# Patient Record
Sex: Male | Born: 1965 | Race: White | Hispanic: No | Marital: Single | Smoking: Current every day smoker
Health system: Southern US, Community
[De-identification: ages and names within clinical notes are randomized; demographics above are authoritative.]

## PROBLEM LIST (undated history)

## (undated) DIAGNOSIS — M5137 Other intervertebral disc degeneration, lumbosacral region: Secondary | ICD-10-CM

## (undated) DIAGNOSIS — M51379 Other intervertebral disc degeneration, lumbosacral region without mention of lumbar back pain or lower extremity pain: Secondary | ICD-10-CM

## (undated) HISTORY — PX: HAND SURGERY: SHX662

## (undated) HISTORY — DX: Other intervertebral disc degeneration, lumbosacral region without mention of lumbar back pain or lower extremity pain: M51.379

## (undated) HISTORY — DX: Other intervertebral disc degeneration, lumbosacral region: M51.37

---

## 2002-01-11 ENCOUNTER — Emergency Department (HOSPITAL_COMMUNITY): Admission: EM | Admit: 2002-01-11 | Discharge: 2002-01-11 | Payer: Self-pay | Admitting: Emergency Medicine

## 2008-10-31 ENCOUNTER — Ambulatory Visit (HOSPITAL_BASED_OUTPATIENT_CLINIC_OR_DEPARTMENT_OTHER): Admission: RE | Admit: 2008-10-31 | Discharge: 2008-10-31 | Payer: Self-pay | Admitting: Orthopedic Surgery

## 2009-01-17 ENCOUNTER — Ambulatory Visit (HOSPITAL_BASED_OUTPATIENT_CLINIC_OR_DEPARTMENT_OTHER): Admission: RE | Admit: 2009-01-17 | Discharge: 2009-01-17 | Payer: Self-pay | Admitting: Orthopedic Surgery

## 2009-01-29 ENCOUNTER — Encounter (INDEPENDENT_AMBULATORY_CARE_PROVIDER_SITE_OTHER): Payer: Self-pay | Admitting: Orthopedic Surgery

## 2009-01-29 ENCOUNTER — Ambulatory Visit (HOSPITAL_BASED_OUTPATIENT_CLINIC_OR_DEPARTMENT_OTHER): Admission: RE | Admit: 2009-01-29 | Discharge: 2009-01-29 | Payer: Self-pay | Admitting: Orthopedic Surgery

## 2011-03-30 LAB — WOUND CULTURE: Gram Stain: NONE SEEN

## 2011-03-30 LAB — POCT HEMOGLOBIN-HEMACUE: Hemoglobin: 15.4 g/dL (ref 13.0–17.0)

## 2011-03-30 LAB — ANAEROBIC CULTURE

## 2011-04-27 NOTE — Op Note (Signed)
NAMEJAAN, FISCHEL             ACCOUNT NO.:  [id number]   MEDICAL RECORD NO.:  [id number]          PATIENT TYPE:  AMB   LOCATION:  DSC                          FACILITY:  [facility]   PHYSICIAN:  Cindee Salt, M.D.       DATE OF BIRTH:  01/19/1966   DATE OF PROCEDURE:  01/29/2009  DATE OF DISCHARGE:                               OPERATIVE REPORT   PREOPERATIVE DIAGNOSIS:  Dupuytren contracture, left little finger.   POSTOPERATIVE DIAGNOSIS:  Dupuytren contracture, left little finger.   OPERATION:  Excision, palmar fascia, V-Y advancements with proximal  interphalangeal joint release, left little finger.   SURGEON:  Cindee Salt, MD   SUMMARYCarolyne Fiscal, RN   ANESTHESIA:  Axillary block.   ANESTHESIOLOGIST:  Zenon Mayo, MD.   HISTORY:  The patient is a 45 year old male with a history of Dupuytren  contracture of the little fingers bilaterally with 90 plus-degree  flexion deformity to the PIP joint, contracture of the MP joint.  He has  undergone application of the digit with correction to 30 degrees of PIP  flexion.  Unfortunately, he suffered an infection of the pin, the device  was removed.  He is admitted now for excision of the palmar fascia.  There is no evidence of infection at the present time.  He is aware of  risks and complications including infection; recurrence injury to  arteries, nerves, tendons; complete relief of symptoms; dystrophy;  recurrence of the Dupuytren cords; loss of flaps; swelling; stiffness to  the joints.  Questions have been encouraged and answered.  In the  preoperative area, the patient is seen, the extremity marked by both the  patient and surgeon, antibiotic given.  Questions again encouraged and  answered.   PROCEDURE:  The patient was brought to the operating room where an  axillary block was carried out without difficulty.  He was prepped using  DuraPrep in supine position with left arm free.  A time-out was taken  confirming the  patient and procedure.  The limb was exsanguinated with  an Esmarch bandage, tourniquet placed high on the arm was inflated to  250 mmHg with exsanguination only to the elbow.  A volar Brunner  incision was made, carried down through the subcutaneous tissue.  Bleeders were electrocauterized.  The dissection was carried distally  after identification of the palmar fascia.  This was released across the  entire extent.  This allowed elevation.  The digital neurovascular  bundles to each finger were identified.  The cord to the ring finger was  partially excised.  The cord to the little finger was then followed  distally.  This released metacarpophalangeal joint.  A large  contribution was present from the abductor digiti quinti, this cord was  excised.  A spiral nerve was present.  This was carefully dissected free  on the ulnar side.  The dissection was carried out to the middle  phalanx.  This released the entire cord which was sent to pathology.  Both neurovascular bundles were intact.  An incision was then made in  the flexor sheath.  This did not  allow further extension with extension  present to 30 degrees at this point.  The swallowtail ligaments were  then incised.  This allowed full extension of the PIP joint with partial  release of the accessory collateral ligaments.  The finger was able to  be fully straightened and maintained in that position.  The wound was  copiously irrigated with saline.  It was then sprayed with thrombin  spray.  A doubled-over vessel loop was placed in the depths of the  wound.  These were converted to Ys.  The skin was then advanced distally  and suturing into position with interrupted 4-0 Vicryl Rapide sutures.  The tourniquet deflated.  Fingers immediately pinked.  A sterile  compressive dressing splint was applied.  The patient tolerated the  procedure well, was taken to the recovery room for observation in  satisfactory condition.  He will be  discharged home, to return to the  [facility] in 1 week, on Vicodin.           ______________________________  Cindee Salt, M.D.     GK/MEDQ  D:  01/29/2009  T:  01/30/2009  Job:  [id number]

## 2011-04-27 NOTE — Op Note (Signed)
NAMEALBARAA, SWINGLE             ACCOUNT NO.:  [id number]   MEDICAL RECORD NO.:  [id number]          PATIENT TYPE:  AMB   LOCATION:  DSC                          FACILITY:  [facility]   PHYSICIAN:  Cindee Salt, M.D.       DATE OF BIRTH:  September 29, 1966   DATE OF PROCEDURE:  01/17/2009  DATE OF DISCHARGE:                               OPERATIVE REPORT   PREOPERATIVE DIAGNOSIS:  Status post application of Digit Widget, left  little finger.   POSTOPERATIVE DIAGNOSIS:  Status post application of Digit Widget, left  little finger.   OPERATION:  Removal of Digit Widget, left little finger.   SURGEON:  Cindee Salt, MD   ANESTHESIA:  Local.   HISTORY:  The patient is a 45 year old male with a history of Dupuytren  contracture of his left little finger with a 90-degree flexion  deformity.  He has had a Digit Widget placed in November.  He is  admitted now for removal and that the proximal pin has become infected.  He has had relief to approximately a 30-degree flexion deformity.   PROCEDURE:  The patient was brought to the operating room.  A metacarpal  block was given with 1% Xylocaine and 0.25% Marcaine, both without  epinephrine, 8 mL was used.  After adequate anesthesia was afforded, the  patient time-out was taken confirming the operation and the procedure.  Prep was done with DuraPrep.  The external device was removed.  The two  pins were then removed without difficulty.  Cultures were taken for both  aerobic and anaerobic cultures.  A sterile compressive dressing was  applied.  No gross purulence was noted.  The patient will be discharged  home to return in 1 week.  He is on Keflex and Tylenol 3.           ______________________________  Cindee Salt, M.D.     GK/MEDQ  D:  01/17/2009  T:  01/18/2009  Job:  [id number]

## 2011-04-27 NOTE — Op Note (Signed)
NAMEPAETON, STUDER             ACCOUNT NO.:  [id number]   MEDICAL RECORD NO.:  [id number]          PATIENT TYPE:  AMB   LOCATION:  DSC                          FACILITY:  [facility]   PHYSICIAN:  Cindee Salt, M.D.       DATE OF BIRTH:  [birth year]   DATE OF PROCEDURE:  10/31/2008  DATE OF DISCHARGE:                               OPERATIVE REPORT   PREOPERATIVE DIAGNOSIS:  Dupuytren's contracture with 95 degrees flexion  deformity, proximal interphalangeal joint, left little finger.   POSTOPERATIVE DIAGNOSIS:  Dupuytren's contracture with 95 degrees  flexion deformity, proximal interphalangeal joint, left little finger.   OPERATION:  Application of Digit-Widget, left little finger.   SURGEON:  Cindee Salt, MD   ASSISTANT:  Joaquin Courts, RN   ANESTHESIA:  Forearm-based IV regional.   ANESTHESIOLOGIST:  Zenon Mayo, MD   HISTORY:  The patient is a 45 year old male with a history of  Dupuytren's contracture both little fingers with 95 degrees fixed  flexion deformities to the PIP joints with flexion to the  metacarpophalangeal joint.  He is admitted now for application of a  Digit-Widget for extension of the PIP joint of the left little finger.  He is aware of risks and complications including infection, recurrence,  injury to arteries, nerves, tendons, incomplete relief of symptoms,  possibility of dystrophy, possibility of pullout of the device, and the  necessity of removal of the cord at a later date.   PROCEDURE IN DETAIL:  The patient was brought to the operating room  after marking the extremity by both the patient and surgeon.  Antibiotic  being given.  A forearm-based IV regional anesthetic was carried out  without difficulty.  He was prepped using DuraPrep, supine position,  left arm free.  The joint was located with a 22 gauge needle confirmed  with OEC image intensification.  The guide for the application of the 2  distal pins was then placed after marking the  joint line.  The guide was  placed just distal to this.  The proximal drill hole was then made and  confirmed with image intensification.  The second pin was placed  distally.  These were checked with image intensification to be certain  of their position.  The distal pin was then placed by hand and confirmed  into the volar cortex without penetration into the flexor tendons with  OEC.  The proximal screw was then placed.  Again, this was confirmed  with OEC image intensification to not significantly penetrate the volar  cortex, but to engage.  The pins were then cut.  The jig removed.  The  Digit-Widget was placed distally.  The pins were then cut short.  The  remainder of the device was applied along with a low-tension band.  X-  rays confirmed positioning the Digit-Widget.  The patient tolerated the  procedure well and was taken to the recovery room for observation in  satisfactory condition.  He will be discharged home to return to the  [facility] in 1 week on Vicodin.  ______________________________  Cindee Salt, M.D.     GK/MEDQ  D:  10/31/2008  T:  10/31/2008  Job:  [id number]

## 2011-09-14 LAB — POCT HEMOGLOBIN-HEMACUE: Hemoglobin: 15.7

## 2012-02-15 ENCOUNTER — Ambulatory Visit (INDEPENDENT_AMBULATORY_CARE_PROVIDER_SITE_OTHER): Payer: BC Managed Care – PPO | Admitting: Family Medicine

## 2012-02-15 VITALS — BP 128/76 | HR 75 | Temp 98.1°F | Resp 18 | Ht 72.5 in | Wt 208.0 lb

## 2012-02-15 DIAGNOSIS — J029 Acute pharyngitis, unspecified: Secondary | ICD-10-CM

## 2012-02-15 LAB — POCT RAPID STREP A (OFFICE): Rapid Strep A Screen: NEGATIVE

## 2012-02-15 MED ORDER — IPRATROPIUM BROMIDE 0.03 % NA SOLN: 2.0000 | Freq: Four times a day (QID) | NASAL | Status: DC

## 2012-02-15 NOTE — Progress Notes (Signed)
  Patient Name: William Benson Date of Birth: 02/27/1966 Medical Record Number: [id number] Gender: male Date of Encounter: 02/15/2012  History of Present Illness:  William Benson is a 46 y.o. very pleasant male patient who presents with the following:  About 10 days ago he noted a mild ST, and URI symptoms.  Tried allergy meds.  Yesterday noted that his tongue looks white- he had a similar problem in 2011. However, he had a negative KOH and HIV at that time- unclear if he had thrush for sure at that time for not.  Other URI symtposm are better.  It does hurt to eat and drink because his tongue feels sore.  Voice is still horse sometimes.   No recent nasal steroid use, no recent antibiotic use  There is no problem list on file for this patient.  History reviewed. No pertinent past medical history. Past Surgical History  Procedure Date  . Hand surgery    History  Substance Use Topics  . Smoking status: Current Everyday Smoker -- 0.5 packs/day    Types: Cigarettes  . Smokeless tobacco: Never Used  . Alcohol Use: 3.6 oz/week    6 Cans of beer per week   Family History  Problem Relation Age of Onset  . Diabetes Father    Allergies  Allergen Reactions  . Oxycodone Rash    Medication list has been reviewed and updated.  Review of Systems: As per HPI- otherwise negative. No weight loss- has actually gained a few lbs and plans to get back into exercising  Physical Examination: Filed Vitals:   02/15/12 0828  BP: 128/76  Pulse: 75  Temp: 98.1 F (36.7 C)  TempSrc: Oral  Resp: 18  Height: 6' 0.5" (1.842 m)  Weight: 208 lb (94.348 kg)    Body mass index is 27.82 kg/(m^2).  GEN: WDWN, NAD, Non-toxic, A & O x 3 HEENT: Atraumatic, Normocephalic. Neck supple. No masses, No LAD.  TM wnl bilaterally, nasal cavity is congested and inflamed.  Oropharynx shows erythema- there is no exudate and no thrush Ears and Nose: No external deformity. CV: RRR, No M/G/R. No JVD. No  thrill. No extra heart sounds. PULM: CTA B, no wheezes, crackles, rhonchi. No retractions. No resp. distress. No accessory muscle use. EXTR: No c/c/e NEURO Normal gait.  PSYCH: Normally interactive. Conversant. Not depressed or anxious appearing.  Calm demeanor.   Results for orders placed in visit on 02/15/12  POCT RAPID STREP A (OFFICE)      Component Value Range   Rapid Strep A Screen Negative  Negative     Assessment and Plan: 1. Sore throat  POCT rapid strep A, Throat culture ([facility]), ipratropium (ATROVENT) 0.03 % nasal spray   Suspect he has a viral infection vs PND causing ST.  I do not think that he has oral thrush, and will defer antibiotics as he has a negative RS- await throat culture.  Trial of meds above- let us know if not better in a couple of days- Sooner if worse.    Gave rx for DMM

## 2012-02-17 LAB — CULTURE, GROUP A STREP: Organism ID, Bacteria: NORMAL

## 2012-02-22 ENCOUNTER — Ambulatory Visit: Payer: BC Managed Care – PPO

## 2012-02-22 ENCOUNTER — Ambulatory Visit (INDEPENDENT_AMBULATORY_CARE_PROVIDER_SITE_OTHER): Payer: BC Managed Care – PPO | Admitting: Family Medicine

## 2012-02-22 VITALS — BP 118/75 | HR 75 | Temp 97.9°F | Resp 16 | Ht 73.0 in | Wt 206.0 lb

## 2012-02-22 DIAGNOSIS — J029 Acute pharyngitis, unspecified: Secondary | ICD-10-CM

## 2012-02-22 DIAGNOSIS — K219 Gastro-esophageal reflux disease without esophagitis: Secondary | ICD-10-CM

## 2012-02-22 MED ORDER — AZITHROMYCIN 250 MG PO TABS: ORAL_TABLET | ORAL | Status: AC

## 2012-02-22 NOTE — Progress Notes (Signed)
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 He's tried multiple over-the-counter medications since his father is a Teacher, early years/pre. He also came in a week ago had a throat culture and some evaluation but these measure mouthwash prescribed has not resolved the problem. He's not had any fever, and the cough is just intermittent to clear his throat. He does not have a history of allergies and does not have sinus congestion at present.  Objective: HEENT is unremarkable with the exception of red throat and some postnasal drainage.  He's in no acute distress, very nice man who is cooperative and alert.  Chest is clear  Heart is regular  Neck is supple without adenopathy  Assessment persistent pharyngitis  Plan Z-Pak

## 2012-09-25 ENCOUNTER — Ambulatory Visit (INDEPENDENT_AMBULATORY_CARE_PROVIDER_SITE_OTHER): Payer: BC Managed Care – PPO | Admitting: Internal Medicine

## 2012-09-25 ENCOUNTER — Encounter: Payer: Self-pay | Admitting: Internal Medicine

## 2012-09-25 VITALS — BP 126/74 | HR 80 | Temp 97.8°F | Resp 16 | Ht 73.0 in | Wt 213.0 lb

## 2012-09-25 DIAGNOSIS — R5381 Other malaise: Secondary | ICD-10-CM

## 2012-09-25 DIAGNOSIS — R5383 Other fatigue: Secondary | ICD-10-CM

## 2012-09-25 DIAGNOSIS — R61 Generalized hyperhidrosis: Secondary | ICD-10-CM

## 2012-09-25 DIAGNOSIS — N529 Male erectile dysfunction, unspecified: Secondary | ICD-10-CM

## 2012-09-25 DIAGNOSIS — G4700 Insomnia, unspecified: Secondary | ICD-10-CM

## 2012-09-25 LAB — POCT CBC
Granulocyte percent: 70.8 %G (ref 37–80)
HCT, POC: 49.5 % (ref 43.5–53.7)
POC Granulocyte: 6.9 (ref 2–6.9)
POC LYMPH PERCENT: 23.9 %L (ref 10–50)
RBC: 5.07 M/uL (ref 4.69–6.13)
RDW, POC: 12.9 %

## 2012-09-25 LAB — COMPREHENSIVE METABOLIC PANEL
AST: 36 U/L (ref 0–37)
Albumin: 4.5 g/dL (ref 3.5–5.2)
Alkaline Phosphatase: 57 U/L (ref 39–117)
Glucose, Bld: 94 mg/dL (ref 70–99)
Potassium: 4.8 mEq/L (ref 3.5–5.3)
Sodium: 140 mEq/L (ref 135–145)
Total Protein: 6.6 g/dL (ref 6.0–8.3)

## 2012-09-25 LAB — LIPID PANEL
LDL Cholesterol: 155 mg/dL — ABNORMAL HIGH (ref 0–99)
Triglycerides: 174 mg/dL — ABNORMAL HIGH (ref <150)

## 2012-09-25 LAB — PSA: PSA: 1.08 ng/mL (ref <=4.00)

## 2012-09-25 LAB — POCT SEDIMENTATION RATE: POCT SED RATE: 6 mm/hr (ref 0–22)

## 2012-09-25 MED ORDER — ZOLPIDEM TARTRATE 10 MG PO TABS: 10.0000 mg | ORAL_TABLET | Freq: Every evening | ORAL | Status: DC | PRN

## 2012-09-25 NOTE — Progress Notes (Addendum)
  Subjective:    Patient ID: William Benson, male    DOB: [birth year], 46 y.o.   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 Continues to smoke but is greatly decreased and now below half pack per day Occasional alcohol/no illegal drugs  Review of Systems Dup contrac/Kuzma surgery 2 yr ago   AR-Zyrtec/atrovent nasal-stable GERD now stable onprevecid No reactive airway disease or dyspnea No palpitations chest pain or edema No abdominal pain/no change in bowel movements No dysuria frequency nocturia or polyuria Has episodes of erectile dysfunction though in frequent No urinary obstruction symptoms/no testicular pain No neuromuscular bone and joint complaints No cold intolerance/no hair or skin changes No depression or anxiety Objective:   Physical Exam Blood pressure 126/74 Weight 213 height 6 feet 1 inches HEENT clear No thyromegaly or lymphadenopathy Lungs clear to auscultation Heart regular without murmur Abdomen benign without organomegaly or masses Extremities with full peripheral pulses Cranial 2 through 12 intact Gait intact     Assessment & Plan:   1. Fatigue  POCT CBC, Comprehensive metabolic panel, TSH  2. ED (erectile dysfunction)  Lipid panel, PSA, Testosterone, free, total  3. Insomnia    4. Night sweats  POCT SEDIMENTATION RATE  5.  Nicotine addiction             Continue cessation If all labs are normal then we will approach this as a sleep problem and triy to ensure adequate sleep for a  month to see the effect on his symptoms Meds ordered this encounter  Medications  . lansoprazole (PREVACID) 15 MG capsule    Sig: Take 15 mg by mouth daily.     He has this  . zyrtec    Sig: Take 10 mg by mouth daily.      He has this  . zolpidem (AMBIEN) 10 MG tablet    Sig: Take 1 tablet (10 mg total) by mouth at bedtime as needed for sleep.    Dispense:  30 tablet    Refill:  1

## 2012-09-26 LAB — TESTOSTERONE, FREE, TOTAL, SHBG: Testosterone: 477 ng/dL (ref 300–890)

## 2012-09-30 ENCOUNTER — Encounter: Payer: Self-pay | Admitting: Internal Medicine

## 2012-11-01 ENCOUNTER — Ambulatory Visit (INDEPENDENT_AMBULATORY_CARE_PROVIDER_SITE_OTHER): Payer: BC Managed Care – PPO | Admitting: Internal Medicine

## 2012-11-01 VITALS — BP 115/71 | HR 82 | Temp 97.8°F | Resp 16 | Ht 73.0 in | Wt 211.0 lb

## 2012-11-01 DIAGNOSIS — R5383 Other fatigue: Secondary | ICD-10-CM

## 2012-11-01 DIAGNOSIS — R5381 Other malaise: Secondary | ICD-10-CM

## 2012-11-01 NOTE — Progress Notes (Signed)
  Subjective:    Patient ID: William Benson, male    DOB: 15-Nov-1966, 46 y.o.   MRN: [id number]  HPIsee 10/13 Sleeping better/more energy Sees the need for exercise and wt loss-espec w/ lipids More optimistic Still no sleep apnea axt   Review of Systems No new sxtoms   GERD stable Objective:   Physical Exam VS stable     Assessment & Plan:  Fatigue/insomnia/erectile dysfunction  Continue with plan for Ambien if needed/better diet/exercise and weight loss/repeat lipids in 6 months Prescription for Ambien-he has enough now-can call when he needs this until f/u 6 mos #20tabs

## 2012-12-28 ENCOUNTER — Other Ambulatory Visit: Payer: Self-pay | Admitting: Internal Medicine

## 2013-02-12 ENCOUNTER — Ambulatory Visit: Payer: BC Managed Care – PPO

## 2013-02-12 ENCOUNTER — Ambulatory Visit (INDEPENDENT_AMBULATORY_CARE_PROVIDER_SITE_OTHER): Payer: BC Managed Care – PPO | Admitting: Family Medicine

## 2013-02-12 DIAGNOSIS — S61409A Unspecified open wound of unspecified hand, initial encounter: Secondary | ICD-10-CM

## 2013-02-12 DIAGNOSIS — M79609 Pain in unspecified limb: Secondary | ICD-10-CM

## 2013-02-12 DIAGNOSIS — S61401A Unspecified open wound of right hand, initial encounter: Secondary | ICD-10-CM

## 2013-02-12 DIAGNOSIS — W19XXXA Unspecified fall, initial encounter: Secondary | ICD-10-CM

## 2013-02-12 DIAGNOSIS — Z23 Encounter for immunization: Secondary | ICD-10-CM

## 2013-02-12 DIAGNOSIS — M720 Palmar fascial fibromatosis [Dupuytren]: Secondary | ICD-10-CM

## 2013-02-12 DIAGNOSIS — G4700 Insomnia, unspecified: Secondary | ICD-10-CM

## 2013-02-12 MED ORDER — ZOLPIDEM TARTRATE 10 MG PO TABS: ORAL_TABLET | ORAL | Status: DC

## 2013-02-12 NOTE — Progress Notes (Signed)
[facility] [address] [place] [address] [phone]  Date:  02/12/2013   Name:  William Benson   DOB:  09/23/1966   MRN:  [id number]  PCP:  Tally Due, MD    Chief Complaint: Hand Pain   History of Present Illness:  William Benson is a 47 y.o. very pleasant male patient who presents with the following:  9 days ago he fell and hurt his right hand- he tripped over a walkway and did a sort of FOOSH on his right hand. The area is less swollen now but still tender.   No other injury, no head injury He did cut his right long finger a little bit- this has healed He has "no idea" of the date of his last tetanus shot The hand never did bruise His hand felt quite sore last night so he decided to come in- however today it is finally feeling better  He does have Dupuytren's contracture of both hands- his left hand has been operated on by Dr. Merlyn Lot, his left hand has involvement of the small finger  He also uses ambien PRN for sleep and has good success with this- would like a refill if possible Reviewed paper chart- no tetanus vaccine present  Patient Active Problem List  Diagnosis  . GERD (gastroesophageal reflux disease)    History reviewed. No pertinent past medical history.  Past Surgical History  Procedure Laterality Date  . Hand surgery      History  Substance Use Topics  . Smoking status: Current Every Day Smoker -- 0.50 packs/day    Types: Cigarettes  . Smokeless tobacco: Never Used  . Alcohol Use: 3.6 oz/week    6 Cans of beer per week    Family History  Problem Relation Age of Onset  . Diabetes Father     Allergies  Allergen Reactions  . Oxycodone Rash    Medication list has been reviewed and updated.  Current Outpatient Prescriptions on File Prior to Visit  Medication Sig Dispense Refill  . cetirizine (ZYRTEC) 10 MG tablet Take 10 mg by mouth daily.      . lansoprazole (PREVACID) 15 MG capsule Take 15 mg by  mouth daily.      Marland Kitchen zolpidem (AMBIEN) 10 MG tablet TAKE 1 TABLET BY MOUTH EVERY NIGHT AT BEDTIME AS NEEDED FOR SLEEP  30 tablet  0  . dexlansoprazole (DEXILANT) 60 MG capsule Take 60 mg by mouth daily.      Marland Kitchen ipratropium (ATROVENT) 0.03 % nasal spray Place 2 sprays into the nose 4 (four) times daily.  30 mL  6   No current facility-administered medications on file prior to visit.    Review of Systems:  As per HPI- otherwise negative.   Physical Examination: Filed Vitals:   02/12/13 0805  BP: 133/89  Pulse: 89  Temp: 98.2 F (36.8 C)  Resp: 18   Filed Vitals:   02/12/13 0805  Height: 6\' 2"  (1.88 m)  Weight: 213 lb (96.616 kg)   Body mass index is 27.34 kg/(m^2). Ideal Body Weight: Weight in (lb) to have BMI = 25: 194.3  GEN: WDWN, NAD, Non-toxic, A & O x 3 HEENT: Atraumatic, Normocephalic. Neck supple. No masses, No LAD. Ears and Nose: No external deformity. CV: RRR, No M/G/R. No JVD. No thrill. No extra heart sounds. PULM: CTA B, no wheezes, crackles, rhonchi. No retractions. No resp. distress. No accessory muscle use. EXTR: No c/c/e NEURO Normal gait.  PSYCH: Normally interactive. Conversant. Not depressed or anxious appearing.  Calm demeanor.  Right hand: he has contracture of the small finger, and some involvement of the ring finger.  Slight swelling over the dorsum of the hand, and tenderness over the 4th [facility] and 4th proximal phalanx.  Minimal swelling and no bruise  [facility] reading (PRIMARY) by  Dr. Patsy Lager. Right hand: (pt has Dupuytren's contracture)- contracture slightly limits interpretation but no fracture is noted RIGHT HAND - COMPLETE 3+ VIEW  Comparison: None.  Findings: Flexion deformity of the right fifth finger. This limits  evaluation because of overlapping bony shadows. No definite acute  displaced fracture or focal swelling. No soft tissue abnormality.  IMPRESSION:  Flexion deformity of the right fifth digit compatible with  contracture.  No acute  osseous finding.  Assessment and Plan: Pain, hand, right - Plan: DG Hand Complete Right  Fall, initial encounter  Insomnia - Plan: zolpidem (AMBIEN) 10 MG tablet  Open wound of hand with complication, right, initial encounter - Plan: Tdap vaccine greater than or equal to 7yo IM  Dupuytren's contracture of both hands  Updated tdap today.  Refilled ambien.  He is aware that his x-rays are somewhat limited by his contracture.  Offered to place a splint today.  However, he prefers to see how he does over the next few days as his hand is feeling better today.  He will follow- up if not continuing to improve  Meds ordered this encounter  Medications  . Lysine HCl 1000 MG TABS    Sig: Take by mouth.  . zolpidem (AMBIEN) 10 MG tablet    Sig: TAKE 1 TABLET BY MOUTH EVERY NIGHT AT BEDTIME AS NEEDED FOR SLEEP    Dispense:  30 tablet    Refill:  2     COPLAND,JESSICA, MD

## 2013-02-12 NOTE — Patient Instructions (Addendum)
Keep an eye on your hand- if it does not continue to get better please call or come back and see me later on this week.

## 2013-05-02 ENCOUNTER — Encounter: Payer: Self-pay | Admitting: Internal Medicine

## 2013-05-02 ENCOUNTER — Ambulatory Visit (INDEPENDENT_AMBULATORY_CARE_PROVIDER_SITE_OTHER): Payer: BC Managed Care – PPO | Admitting: Internal Medicine

## 2013-05-02 VITALS — BP 116/66 | HR 71 | Temp 97.9°F | Resp 16 | Ht 72.5 in | Wt 197.2 lb

## 2013-05-02 DIAGNOSIS — R7989 Other specified abnormal findings of blood chemistry: Secondary | ICD-10-CM

## 2013-05-02 DIAGNOSIS — E785 Hyperlipidemia, unspecified: Secondary | ICD-10-CM

## 2013-05-02 DIAGNOSIS — R945 Abnormal results of liver function studies: Secondary | ICD-10-CM

## 2013-05-02 DIAGNOSIS — G4700 Insomnia, unspecified: Secondary | ICD-10-CM

## 2013-05-02 LAB — CBC WITH DIFFERENTIAL/PLATELET
Basophils Relative: 1 % (ref 0–1)
Eosinophils Absolute: 0.1 10*3/uL (ref 0.0–0.7)
Eosinophils Relative: 1 % (ref 0–5)
Hemoglobin: 14.8 g/dL (ref 13.0–17.0)
MCH: 31.4 pg (ref 26.0–34.0)
MCHC: 35 g/dL (ref 30.0–36.0)
Monocytes Relative: 6 % (ref 3–12)
Neutrophils Relative %: 59 % (ref 43–77)
Platelets: 281 10*3/uL (ref 150–400)

## 2013-05-02 LAB — LIPID PANEL
Total CHOL/HDL Ratio: 4.3 Ratio
VLDL: 31 mg/dL (ref 0–40)

## 2013-05-02 LAB — COMPREHENSIVE METABOLIC PANEL
AST: 31 U/L (ref 0–37)
Albumin: 4.7 g/dL (ref 3.5–5.2)
Alkaline Phosphatase: 63 U/L (ref 39–117)
Potassium: 4.5 mEq/L (ref 3.5–5.3)
Sodium: 136 mEq/L (ref 135–145)
Total Protein: 6.6 g/dL (ref 6.0–8.3)

## 2013-05-02 MED ORDER — ZOLPIDEM TARTRATE 10 MG PO TABS: ORAL_TABLET | ORAL | Status: DC

## 2013-05-02 NOTE — Progress Notes (Addendum)
  Subjective:    Patient ID: William Benson, male    DOB: [birth year], 47 y.o.   MRN: [id number]  HPI here for followup Cholesterol 251/LDL 155 6 months ago He has lost over 15 pounds/his diet is excellent At his last labs he also had one elevated liver enzyme  Diet-greatly improved over last visit Activity-greatly increased including time in the gym [employer] [place]  Review of Systems Lysine has prevented oral ulcers Prevacid controls reflux Zyrtec helps allergies   his insomnia is much improved as well and he needs rare doses of Ambien Objective:   Physical Exam BP 116/66  Pulse 71  Temp(Src) 97.9 F (36.6 C) (Oral)  Resp 16  Ht 6' 0.5" (1.842 m)  Wt 197 lb 3.2 oz (89.449 kg)  BMI 26.36 kg/m2  SpO2 96% HEENT clear Heart regular Extremities clear       Assessment & Plan:  Problem #1 hyperlipidemia Problem 2 overweight Problem #3 allergic rhinitis 4 recurrent oral ulcers Problem 5 insomnia Problem #6 abnormal liver function studies  Meds ordered this encounter  Medications  . zolpidem (AMBIEN) 10 MG tablet    Sig: TAKE 1 TABLET BY MOUTH EVERY NIGHT AT BEDTIME AS NEEDED FOR SLEEP    Dispense:  30 tablet    Refill:  5   Notify labs He is doing extremely well

## 2013-05-02 NOTE — Progress Notes (Signed)
  Subjective:    Patient ID: William Benson, male    DOB: 06-04-1966, 47 y.o.   MRN: [id number]  HPI    Review of Systems  Constitutional: Negative.   HENT: Negative.   Eyes: Negative.   Respiratory: Negative.   Cardiovascular: Negative.   Gastrointestinal: Negative.   Endocrine: Negative.   Genitourinary: Negative.   Musculoskeletal: Negative.   Skin: Negative.   Allergic/Immunologic: Negative.   Neurological: Negative.   Hematological: Negative.   Psychiatric/Behavioral: Negative.        Objective:   Physical Exam        Assessment & Plan:

## 2013-05-03 LAB — PSA: PSA: 0.95 ng/mL (ref <=4.00)

## 2013-05-04 ENCOUNTER — Encounter: Payer: Self-pay | Admitting: Internal Medicine

## 2013-10-31 ENCOUNTER — Encounter: Payer: Self-pay | Admitting: Internal Medicine

## 2013-10-31 ENCOUNTER — Ambulatory Visit (INDEPENDENT_AMBULATORY_CARE_PROVIDER_SITE_OTHER): Payer: BC Managed Care – PPO | Admitting: Internal Medicine

## 2013-10-31 VITALS — BP 118/70 | HR 83 | Temp 97.9°F | Resp 16 | Ht 73.0 in | Wt 212.0 lb

## 2013-10-31 DIAGNOSIS — G4700 Insomnia, unspecified: Secondary | ICD-10-CM

## 2013-10-31 DIAGNOSIS — E785 Hyperlipidemia, unspecified: Secondary | ICD-10-CM

## 2013-10-31 LAB — LIPID PANEL: Total CHOL/HDL Ratio: 5.8 Ratio

## 2013-10-31 MED ORDER — ZOLPIDEM TARTRATE 10 MG PO TABS: ORAL_TABLET | ORAL | Status: DC

## 2013-10-31 NOTE — Progress Notes (Signed)
  Subjective:    Patient ID: William Benson, male    DOB: May 31, 1966, 47 y.o.   MRN: [id number]  HPI Patient presents today for follow up of hyperlipidemia. Has had increased weight gain. Says this is typical for him in the summer time. He usually gains weight over spring/summer when he is busy and loses over the fall/winter when he has more time to go to the gym. Walks greens each day.   Had a groin injury several months ago and this is resolved. Otherwise, everything going well. Occasional right fourth digit pain from injury about a year ago. No swelling. Takes [place] most, but not all nights. Usually awakens 3-4 am, even with ambien. Goes back to sleep. No excessive daytime sleepiness.   Refuses flu shot. Never takes.  Review of Systems neg    Objective:   Physical Exam  Nursing note and vitals reviewed. Constitutional: He is oriented to person, place, and time. He appears well-developed and well-nourished. No distress.  HENT:  Head: Normocephalic and atraumatic.  Eyes: Pupils are equal, round, and reactive to light.  Neck: Normal range of motion.  Cardiovascular: Normal rate and regular rhythm.   Pulmonary/Chest: Effort normal. No respiratory distress.  Musculoskeletal: Normal range of motion.  Neurological: He is alert and oriented to person, place, and time.  Skin: Skin is warm and dry.  Psychiatric: He has a normal mood and affect. His behavior is normal.   BP 118/70  Pulse 83  Temp(Src) 97.9 F (36.6 C) (Oral)  Resp 16  Ht 6\' 1"  (1.854 m)  Wt 212 lb (96.163 kg)  BMI 27.98 kg/m2  SpO2 97%        Assessment & Plan:  Insomnia - Plan: zolpidem (AMBIEN) 10 MG tablet  Other and unspecified hyperlipidemia - Plan: Lipid panel  Meds ordered this encounter  Medications  . zolpidem (AMBIEN) 10 MG tablet    Sig: TAKE 1 TABLET BY MOUTH EVERY NIGHT AT BEDTIME AS NEEDED FOR SLEEP    Dispense:  30 tablet    Refill:  5   F/u 3mo

## 2013-11-05 ENCOUNTER — Encounter: Payer: Self-pay | Admitting: Internal Medicine

## 2013-12-11 ENCOUNTER — Ambulatory Visit (INDEPENDENT_AMBULATORY_CARE_PROVIDER_SITE_OTHER): Payer: BC Managed Care – PPO | Admitting: Emergency Medicine

## 2013-12-11 VITALS — BP 121/84 | HR 90 | Temp 98.0°F | Resp 18 | Ht 73.0 in | Wt 208.0 lb

## 2013-12-11 DIAGNOSIS — J209 Acute bronchitis, unspecified: Secondary | ICD-10-CM

## 2013-12-11 DIAGNOSIS — J018 Other acute sinusitis: Secondary | ICD-10-CM

## 2013-12-11 MED ORDER — PROMETHAZINE-CODEINE 6.25-10 MG/5ML PO SYRP: 5.0000 mL | ORAL_SOLUTION | Freq: Four times a day (QID) | ORAL | Status: DC | PRN

## 2013-12-11 MED ORDER — AMOXICILLIN-POT CLAVULANATE 875-125 MG PO TABS: 1.0000 | ORAL_TABLET | Freq: Two times a day (BID) | ORAL | Status: DC

## 2013-12-11 MED ORDER — PSEUDOEPHEDRINE-GUAIFENESIN ER 60-600 MG PO TB12: 1.0000 | ORAL_TABLET | Freq: Two times a day (BID) | ORAL | Status: DC

## 2013-12-11 NOTE — Progress Notes (Signed)
[facility] [address] [place] [address] [phone]  Date:  12/11/2013   Name:  William Benson   DOB:  20-Aug-1966   MRN:  [id number]  PCP:  Tally Due, MD    Chief Complaint: Cough and Sore Throat   History of Present Illness:  William Benson is a 47 y.o. very pleasant male patient who presents with the following:  1 week sore in mouth worse since Sunday.  On lateral tongue.  Now worse. Has a sore throat and nasal congestion and drainage.  Clear mucoid discharge.  No fever or chills.  No nausea or vomiting.  Has a cough with minimal purulent sputum.  No wheezing or shortness of breath.  No nausea or vomiting.  No stool change or rash.   History of SAR.  No improvement with over the counter medications or other home remedies. Denies other complaint or health concern today.   Patient Active Problem List   Diagnosis Date Noted  . Insomnia 10/31/2013  . Other and unspecified hyperlipidemia 05/02/2013  . Dupuytren's contracture of both hands 02/12/2013  . GERD (gastroesophageal reflux disease) 02/22/2012    No past medical history on file.  Past Surgical History  Procedure Laterality Date  . Hand surgery      History  Substance Use Topics  . Smoking status: Current Every Day Smoker -- 0.50 packs/day    Types: Cigarettes  . Smokeless tobacco: Never Used  . Alcohol Use: 3.6 oz/week    6 Cans of beer per week    Family History  Problem Relation Age of Onset  . Diabetes Father   . Heart disease Mother     Allergies  Allergen Reactions  . Oxycodone Rash    Medication list has been reviewed and updated.  Current Outpatient Prescriptions on File Prior to Visit  Medication Sig Dispense Refill  . cetirizine (ZYRTEC) 10 MG tablet Take 10 mg by mouth daily.      . lansoprazole (PREVACID) 15 MG capsule Take 15 mg by mouth daily.      Marland Kitchen Lysine HCl 1000 MG TABS Take by mouth.      . zolpidem (AMBIEN) 10 MG tablet TAKE 1 TABLET BY  MOUTH EVERY NIGHT AT BEDTIME AS NEEDED FOR SLEEP  30 tablet  5   No current facility-administered medications on file prior to visit.    Review of Systems:  As per HPI, otherwise negative.    Physical Examination: Filed Vitals:   12/11/13 0839  BP: 121/84  Pulse: 90  Temp: 98 F (36.7 C)  Resp: 18   Filed Vitals:   12/11/13 0839  Height: 6\' 1"  (1.854 m)  Weight: 208 lb (94.348 kg)   Body mass index is 27.45 kg/(m^2). Ideal Body Weight: Weight in (lb) to have BMI = 25: 189.1  GEN: WDWN, NAD, Non-toxic, A & O x 3 HEENT: Atraumatic, Normocephalic. Neck supple. No masses, No LAD.  Aphthous ulcer tongue. Ears and Nose: No external deformity. CV: RRR, No M/G/R. No JVD. No thrill. No extra heart sounds. PULM: CTA B, no wheezes, crackles, rhonchi. No retractions. No resp. distress. No accessory muscle use. ABD: S, NT, ND, +BS. No rebound. No HSM. EXTR: No c/c/e NEURO Normal gait.  PSYCH: Normally interactive. Conversant. Not depressed or anxious appearing.  Calm demeanor.    Assessment and Plan: Bronchitis Aphthous ulcer SAR mucinex d augmentin Phen c cod   Signed,  Phillips Odor, MD

## 2013-12-11 NOTE — Patient Instructions (Signed)
Sinusitis Sinusitis is redness, soreness, and swelling (inflammation) of the paranasal sinuses. Paranasal sinuses are air pockets within the bones of your face (beneath the eyes, the middle of the forehead, or above the eyes). In healthy paranasal sinuses, mucus is able to drain out, and air is able to circulate through them by way of your nose. However, when your paranasal sinuses are inflamed, mucus and air can become trapped. This can allow bacteria and other germs to grow and cause infection. Sinusitis can develop quickly and last only a short time (acute) or continue over a long period (chronic). Sinusitis that lasts for more than 12 weeks is considered chronic.  CAUSES  Causes of sinusitis include:  Allergies.  Structural abnormalities, such as displacement of the cartilage that separates your nostrils (deviated septum), which can decrease the air flow through your nose and sinuses and affect sinus drainage.  Functional abnormalities, such as when the small hairs (cilia) that line your sinuses and help remove mucus do not work properly or are not present. SYMPTOMS  Symptoms of acute and chronic sinusitis are the same. The primary symptoms are pain and pressure around the affected sinuses. Other symptoms include:  Upper toothache.  Earache.  Headache.  Bad breath.  Decreased sense of smell and taste.  A cough, which worsens when you are lying flat.  Fatigue.  Fever.  Thick drainage from your nose, which often is green and may contain pus (purulent).  Swelling and warmth over the affected sinuses. DIAGNOSIS  Your caregiver will perform a physical exam. During the exam, your caregiver may:  Look in your nose for signs of abnormal growths in your nostrils (nasal polyps).  Tap over the affected sinus to check for signs of infection.  View the inside of your sinuses (endoscopy) with a special imaging device with a light attached (endoscope), which is inserted into your  sinuses. If your caregiver suspects that you have chronic sinusitis, one or more of the following tests may be recommended:  Allergy tests.  Nasal culture A sample of mucus is taken from your nose and sent to a lab and screened for bacteria.  Nasal cytology A sample of mucus is taken from your nose and examined by your caregiver to determine if your sinusitis is related to an allergy. TREATMENT  Most cases of acute sinusitis are related to a viral infection and will resolve on their own within 10 days. Sometimes medicines are prescribed to help relieve symptoms (pain medicine, decongestants, nasal steroid sprays, or saline sprays).  However, for sinusitis related to a bacterial infection, your caregiver will prescribe antibiotic medicines. These are medicines that will help kill the bacteria causing the infection.  Rarely, sinusitis is caused by a fungal infection. In theses cases, your caregiver will prescribe antifungal medicine. For some cases of chronic sinusitis, surgery is needed. Generally, these are cases in which sinusitis recurs more than 3 times per year, despite other treatments. HOME CARE INSTRUCTIONS   Drink plenty of water. Water helps thin the mucus so your sinuses can drain more easily.  Use a humidifier.  Inhale steam 3 to 4 times a day (for example, sit in the bathroom with the shower running).  Apply a warm, moist washcloth to your face 3 to 4 times a day, or as directed by your caregiver.  Use saline nasal sprays to help moisten and clean your sinuses.  Take over-the-counter or prescription medicines for pain, discomfort, or fever only as directed by your caregiver. SEEK IMMEDIATE MEDICAL   CARE IF:  You have increasing pain or severe headaches.  You have nausea, vomiting, or drowsiness.  You have swelling around your face.  You have vision problems.  You have a stiff neck.  You have difficulty breathing. MAKE SURE YOU:   Understand these  instructions.  Will watch your condition.  Will get help right away if you are not doing well or get worse. Document Released: 11/29/2005 Document Revised: 02/21/2012 Document Reviewed: 12/14/2011 ExitCare Patient Information 2014 [employer].   Bronchitis Bronchitis is the body's way of reacting to injury and/or infection (inflammation) of the bronchi. Bronchi are the air tubes that extend from the windpipe into the lungs. If the inflammation becomes severe, it may cause shortness of breath. CAUSES  Inflammation may be caused by:  A virus.  Germs (bacteria).  Dust.  Allergens.  Pollutants and many other irritants. The cells lining the bronchial tree are covered with tiny hairs (cilia). These constantly beat upward, away from the lungs, toward the mouth. This keeps the lungs free of pollutants. When these cells become too irritated and are unable to do their job, mucus begins to develop. This causes the characteristic cough of bronchitis. The cough clears the lungs when the cilia are unable to do their job. Without either of these protective mechanisms, the mucus would settle in the lungs. Then you would develop pneumonia. Smoking is a common cause of bronchitis and can contribute to pneumonia. Stopping this habit is the single most important thing you can do to help yourself. TREATMENT   Your caregiver may prescribe an antibiotic if the cough is caused by bacteria. Also, medicines that open up your airways make it easier to breathe. Your caregiver may also recommend or prescribe an expectorant. It will loosen the mucus to be coughed up. Only take over-the-counter or prescription medicines for pain, discomfort, or fever as directed by your caregiver.  Removing whatever causes the problem (smoking, for example) is critical to preventing the problem from getting worse.  Cough suppressants may be prescribed for relief of cough symptoms.  Inhaled medicines may be prescribed to help  with symptoms now and to help prevent problems from returning.  For those with recurrent (chronic) bronchitis, there may be a need for steroid medicines. SEEK IMMEDIATE MEDICAL CARE IF:   During treatment, you develop more pus-like mucus (purulent sputum).  You have a fever.  You become progressively more ill.  You have increased difficulty breathing, wheezing, or shortness of breath. It is necessary to seek immediate medical care if you are elderly or sick from any other disease. MAKE SURE YOU:   Understand these instructions.  Will watch your condition.  Will get help right away if you are not doing well or get worse. Document Released: 11/29/2005 Document Revised: 08/01/2013 Document Reviewed: 07/24/2013 ExitCare Patient Information 2014 [employer].  

## 2014-05-01 ENCOUNTER — Ambulatory Visit: Payer: BC Managed Care – PPO | Admitting: Internal Medicine

## 2014-09-04 ENCOUNTER — Encounter: Payer: Self-pay | Admitting: Internal Medicine

## 2014-09-04 ENCOUNTER — Ambulatory Visit (INDEPENDENT_AMBULATORY_CARE_PROVIDER_SITE_OTHER): Payer: BC Managed Care – PPO | Admitting: Internal Medicine

## 2014-09-04 VITALS — BP 110/72 | HR 88 | Temp 98.0°F | Resp 16 | Ht 73.0 in | Wt 193.8 lb

## 2014-09-04 DIAGNOSIS — E785 Hyperlipidemia, unspecified: Secondary | ICD-10-CM

## 2014-09-04 DIAGNOSIS — G4700 Insomnia, unspecified: Secondary | ICD-10-CM

## 2014-09-04 LAB — LIPID PANEL
CHOL/HDL RATIO: 4.6 ratio
Cholesterol: 218 mg/dL — ABNORMAL HIGH (ref 0–200)
HDL: 47 mg/dL (ref >39)
LDL CALC: 148 mg/dL — AB (ref 0–99)
Triglycerides: 114 mg/dL (ref <150)
VLDL: 23 mg/dL (ref 0–40)

## 2014-09-04 MED ORDER — ZOLPIDEM TARTRATE 10 MG PO TABS: ORAL_TABLET | ORAL | Status: DC

## 2014-09-04 NOTE — Progress Notes (Signed)
Subjective:    Patient ID: William Benson, male    DOB: 05-Oct-1966, 48 y.o.   MRN: [id number]  This chart was scribed for Tonye Pearson, MD by Tonye Royalty, ED Scribe. This patient was seen in room 27 and the patient's care was started at 10:46 AM.   HPI  HPI Comments: William Benson is a 48 y.o. male with history of hyperlipidemia who presents to the [facility] for [facility]up. He states he has been well this past year and reports losing weight. He states he still takes Prevacid. He states he has been using Ambien since it ran out in June and reports some difficulty sleeping. He denies sleep apnea or excessively loud snoring. He denies being particularly sleepy while driving or during the day. 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  Wt Readings from Last 3 Encounters:  09/04/14 193 lb 12.8 oz (87.907 kg)  12/11/13 208 lb (94.348 kg)  10/31/13 212 lb (96.163 kg)     Patient Active Problem List   Diagnosis Date Noted  . Insomnia 10/31/2013  . Other and unspecified hyperlipidemia 05/02/2013  . Dupuytren's contracture of both hands 02/12/2013  . GERD (gastroesophageal reflux disease) 02/22/2012      Medication List       This list is accurate as of: 09/04/14 10:45 AM.  Always use your most recent med list.               cetirizine 10 MG tablet  Commonly known as:  ZYRTEC  Take 10 mg by mouth daily.     lansoprazole 15 MG capsule  Commonly known as:  PREVACID  Take 15 mg by mouth daily.     Lysine HCl 1000 MG Tabs  Take by mouth.     promethazine-codeine 6.25-10 MG/5ML syrup  Commonly known as:  PHENERGAN with CODEINE  Take 5-10 mLs by mouth every 6 (six) hours as needed.     zolpidem 10 MG tablet  Commonly known as:  AMBIEN  TAKE 1 TABLET BY MOUTH EVERY NIGHT AT BEDTIME AS NEEDED FOR SLEEP         Review of  Systems  Constitutional: Negative for activity change, fatigue and unexpected weight change.  Eyes: Negative for visual disturbance.  Respiratory: Negative for cough and shortness of breath.   Cardiovascular: Positive for leg swelling. Negative for chest pain and palpitations.  Gastrointestinal: Negative for abdominal pain and diarrhea.       Reflux contr by PPI  Genitourinary: Negative for difficulty urinating.  Psychiatric/Behavioral: Positive for sleep disturbance.  no sx sleep apnea      Objective:   Physical Exam  Nursing note and vitals reviewed. Constitutional: He is oriented to person, place, and time. He appears well-developed and well-nourished.  HENT:  Head: Normocephalic and atraumatic.  Eyes: Conjunctivae are normal.  Neck: Normal range of motion. Neck supple.  Pulmonary/Chest: Effort normal.  Musculoskeletal: Normal range of motion.  Neurological: He is alert and oriented to person, place, and time.  Skin: Skin is warm and dry.  Psychiatric: He has a normal mood and affect.    tdap '14      Assessment & Plan:   I have completed the patient encounter in its entirety as documented by the scribe, with editing by me where necessary. Jacee Enerson P. Merla Riches,  M.D.  Insomnia - Plan: zolpidem (AMBIEN) 10 MG tablet  Other and unspecified hyperlipidemia - Plan: Lipid panel  Meds ordered this encounter  Medications  . zolpidem (AMBIEN) 10 MG tablet    Sig: TAKE 1 TABLET BY MOUTH EVERY NIGHT AT BEDTIME AS NEEDED FOR SLEEP    Dispense:  30 tablet    Refill:  5   Copy labs to him

## 2014-09-06 ENCOUNTER — Encounter: Payer: Self-pay | Admitting: Internal Medicine

## 2014-12-20 ENCOUNTER — Ambulatory Visit: Payer: BLUE CROSS/BLUE SHIELD

## 2015-02-26 ENCOUNTER — Ambulatory Visit (INDEPENDENT_AMBULATORY_CARE_PROVIDER_SITE_OTHER): Payer: Self-pay | Admitting: Emergency Medicine

## 2015-02-26 ENCOUNTER — Ambulatory Visit (INDEPENDENT_AMBULATORY_CARE_PROVIDER_SITE_OTHER): Payer: Self-pay

## 2015-02-26 VITALS — BP 104/74 | HR 75 | Temp 97.6°F | Resp 16 | Ht 73.0 in | Wt 192.0 lb

## 2015-02-26 DIAGNOSIS — M5441 Lumbago with sciatica, right side: Secondary | ICD-10-CM

## 2015-02-26 DIAGNOSIS — M1990 Unspecified osteoarthritis, unspecified site: Secondary | ICD-10-CM | POA: Insufficient documentation

## 2015-02-26 DIAGNOSIS — M4726 Other spondylosis with radiculopathy, lumbar region: Secondary | ICD-10-CM

## 2015-02-26 DIAGNOSIS — M5442 Lumbago with sciatica, left side: Secondary | ICD-10-CM

## 2015-02-26 MED ORDER — TIZANIDINE HCL 4 MG PO TABS: 4.0000 mg | ORAL_TABLET | Freq: Three times a day (TID) | ORAL | Status: DC | PRN

## 2015-02-26 MED ORDER — PREDNISONE 20 MG PO TABS
ORAL_TABLET | ORAL | Status: DC
Start: 2015-02-26 — End: 2015-03-05

## 2015-02-26 NOTE — Progress Notes (Signed)
02/27/2015 at 10:27 AM  William Benson / DOB: [birth year] / MRN: [id number]  The patient has GERD (gastroesophageal reflux disease); Dupuytren's contracture of both hands; Other and unspecified hyperlipidemia; Insomnia; and DJD (degenerative joint disease) on his problem list.  SUBJECTIVE  Chief compalaint: Hip Pain and Dealing with Fathers death   History of present illness: William Benson is 49 y.o. well appearing male presenting for worsening low back pain which he states is severe. This started 7 days ago and is worsening . Associated symptoms include paresthesia down the left leg.  He denies saddle paresthesia, changes in bowel or bladder, and numbness . Ambulation aggravates his symptoms, and resting alleviates them. He has tried Ibuprofen and Tylenol with poor relief. He has tried some "left over stroids" that he received from his brother who is a Teacher, early years/pre, in the past two weeks and this aslo did not help his pain.  He has not had PT for the pain.  He reports good relief with Valium in the past.  He reports a pruritic allergy to hydrocodone.     He  has no past medical history on file.    He has a current medication list which includes the following prescription(s): cetirizine, lansoprazole, lysine hcl, and zolpidem.  William Benson is allergic to oxycodone. He  reports that he has been smoking Cigarettes.  He has been smoking about 0.50 packs per day. He has never used smokeless tobacco. He reports that he drinks about 3.6 oz of alcohol per week. He reports that he does not use illicit drugs. He  reports that he currently engages in sexual activity. The patient  has past surgical history that includes Hand surgery.  His family history includes Diabetes in his father; Heart disease in his mother.  Review of Systems  HENT: Negative.   Respiratory: Negative.   Cardiovascular: Negative.   Gastrointestinal: Negative.   Musculoskeletal: Positive for myalgias.    OBJECTIVE  His   height is  (1.854 m) and weight is 192 lb (87.091 kg). His oral temperature is 97.6 F (36.4 C). His blood pressure is 104/74 and his pulse is 75. His respiration is 16 and oxygen saturation is 96%.  The patient's body mass index is 25.34 kg/(m^2).  Physical Exam  Constitutional: He is oriented to person, place, and time. He appears well-developed and well-nourished. No distress.  Cardiovascular: Normal rate and regular rhythm.   Respiratory: Effort normal and breath sounds normal.  GI: Soft. Bowel sounds are normal.  Musculoskeletal:       Lumbar back: He exhibits normal range of motion, no tenderness, no bony tenderness, no swelling, no edema, no deformity, no laceration, no pain, no spasm and normal pulse.  Neurological: He is alert and oriented to person, place, and time. He has normal strength. He displays no atrophy and no tremor. No cranial nerve deficit or sensory deficit. He exhibits normal muscle tone. He displays no Babinski's sign on the right side. He displays no Babinski's sign on the left side.  Reflex Scores:      Tricep reflexes are 2+ on the right side and 2+ on the left side.      Bicep reflexes are 2+ on the right side and 2+ on the left side. SLR negative bilaterally.   Skin: Skin is warm and dry. He is not diaphoretic.  Psychiatric: He has a normal mood and affect. His behavior is normal. Judgment and thought content normal.    No results found for this  or any previous visit (from the past 24 hour(s)).  [facility] reading (PRIMARY) by  Dr. Cleta Albertsaub:  Arthritic changes noted throughout the lumbar spine.  Decreased disk space between L5 and S1.    ASSESSMENT & PLAN  William Benson was seen today for hip pain and dealing with fathers death.  Diagnoses and all orders for this visit:  Left-sided low back pain with right-sided sciatica Orders: -     DG Lumbar Spine Complete; Future -     predniSONE (DELTASONE) 20 MG tablet; Take 3 PO QAM x3days, 2 PO QAM x3days, 1 PO QAM x3days -      tiZANidine (ZANAFLEX) 4 MG tablet; Take 1 tablet (4 mg total) by mouth every 8 (eight) hours as needed for muscle spasms (Do not operate heavy machinery.).  Osteoarthritis of spine with radiculopathy, lumbar region: Patient has an appointment with orthopedics in early April.  He has some insurance problems and does not want a referral today so that he may get in earlier. Advised that he avoid opioids for this problem, and that he would likely be a good candidate for intraarticular lumbar injections.      The patient was advised to call or come back to clinic if he does not see an improvement in symptoms, or worsens with the above plan.   Deliah BostonMichael Clark, MHS, PA-C [facility] 02/27/2015 10:27 AM

## 2015-03-05 ENCOUNTER — Encounter: Payer: Self-pay | Admitting: Internal Medicine

## 2015-03-05 ENCOUNTER — Ambulatory Visit (INDEPENDENT_AMBULATORY_CARE_PROVIDER_SITE_OTHER): Payer: Self-pay | Admitting: Internal Medicine

## 2015-03-05 VITALS — BP 132/84 | HR 89 | Temp 98.0°F | Resp 16 | Ht 73.0 in | Wt 193.0 lb

## 2015-03-05 DIAGNOSIS — M4726 Other spondylosis with radiculopathy, lumbar region: Secondary | ICD-10-CM

## 2015-03-05 DIAGNOSIS — G4700 Insomnia, unspecified: Secondary | ICD-10-CM

## 2015-03-05 DIAGNOSIS — M5441 Lumbago with sciatica, right side: Secondary | ICD-10-CM

## 2015-03-05 DIAGNOSIS — M5442 Lumbago with sciatica, left side: Secondary | ICD-10-CM

## 2015-03-05 MED ORDER — PREDNISONE 20 MG PO TABS: ORAL_TABLET | ORAL | Status: DC

## 2015-03-05 MED ORDER — TRAMADOL HCL 50 MG PO TABS: 100.0000 mg | ORAL_TABLET | Freq: Every evening | ORAL | Status: DC | PRN

## 2015-03-05 MED ORDER — DIAZEPAM 10 MG PO TABS: 10.0000 mg | ORAL_TABLET | Freq: Every evening | ORAL | Status: DC | PRN

## 2015-03-05 MED ORDER — ZOLPIDEM TARTRATE 10 MG PO TABS: ORAL_TABLET | ORAL | Status: AC

## 2015-03-05 MED ORDER — TIZANIDINE HCL 6 MG PO CAPS: 6.0000 mg | ORAL_CAPSULE | Freq: Three times a day (TID) | ORAL | Status: DC

## 2015-03-06 NOTE — Progress Notes (Signed)
   Subjective:    Patient ID: William Benson, male    DOB: 05-Jun-1966, 49 y.o.   MRN: [id number]  HPI  Chief Complaint  Patient presents with  . Follow-up    back pain    note 02/26/2015 visit with Dr. 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 Hurts to drive. Has been treated by chiropractor known to his brother in [place] with some success and may continue those treatments. He has not done further PT as he has lost his insurance. Zanaflex given 316 of no benefit. Prednisone improved symptoms for the first 3 days and then relapsed.  He would like a refill of his medicine for insomnia--still working well without side effects   Review of Systems Noncontributory    Objective:   Physical Exam BP 132/84 mmHg  Pulse 89  Temp(Src) 98 F (36.7 C) (Oral)  Resp 16  Ht 6\' 1"  (1.854 m)  Wt 193 lb (87.544 kg)  BMI 25.47 kg/m2  SpO2 97% No acute distress Hips have fair range of motion He is tender over the lumbar area and general Straight leg raise produces symptoms bilaterally but much more pronounced on the right Reflexes sensory and motor intact      Assessment & Plan:  Insomnia - Plan: zolpidem (AMBIEN) 10 MG tablet  Left-sided low back pain with right-sided sciatica Osteoarthritis of spine with radiculopathy, lumbar region  Needs orthopedic evaluation with further treatment to be established after insurance restarts  Another round of prednisone  Increase Zanaflex  Use Valium at bedtime  Restorative poses for home exercises described  Occasional tramadol if needed for pain at bedtime if cannot sleep  Meds ordered this encounter  Medications  . zolpidem (AMBIEN) 10 MG tablet    Sig: TAKE 1 TABLET BY MOUTH EVERY NIGHT AT BEDTIME AS NEEDED FOR SLEEP    Dispense:  30  tablet    Refill:  5  . predniSONE (DELTASONE) 20 MG tablet    Sig: 3/3/3/2/2/2/1/1/1single daily dose for 9 days    Dispense:  16 tablet    Refill:  0  . diazepam (VALIUM) 10 MG tablet    Sig: Take 1 tablet (10 mg total) by mouth at bedtime as needed for anxiety.    Dispense:  30 tablet    Refill:  2  . tizanidine (ZANAFLEX) 6 MG capsule    Sig: Take 1 capsule (6 mg total) by mouth 3 (three) times daily.    Dispense:  90 capsule    Refill:  1  . traMADol (ULTRAM) 50 MG tablet    Sig: Take 2 tablets (100 mg total) by mouth at bedtime as needed.    Dispense:  60 tablet    Refill:  0   He will follow-up for routine health care in 3-4 months

## 2015-03-14 HISTORY — PX: BACK SURGERY: SHX140

## 2015-03-18 ENCOUNTER — Telehealth: Payer: Self-pay

## 2015-03-18 NOTE — Telephone Encounter (Signed)
Patient last saw William Benson and  is calling because he has an appointment with [facility] on the 11 th and now that he has his insurance straightened out he would like to get an appointment soon as possible due to being in excruciating pain. Referrals advised that I tell the patient to call [facility] and see any appointments available sooner. Patient states that if he can't get an appointment then he has no choice but to call [phone] because the pain is intolerable. Please call patient and advise! [phone]

## 2015-03-18 NOTE — Telephone Encounter (Signed)
Called pt--he got his appt moved up to 1:30 today.

## 2015-03-20 NOTE — Telephone Encounter (Signed)
Pt dropped off a form for Dr Merla Richesoolittle to fill out asap for back surgery. [facility] will schedule it as soon as they get this form back. When completed please fax to [phone] and call pt at [phone]. Thank you

## 2015-03-20 NOTE — Telephone Encounter (Signed)
Forms placed in dr doolittle's box.

## 2015-03-20 NOTE — Telephone Encounter (Signed)
Form is @ clinical TL nurses station in box

## 2015-04-01 ENCOUNTER — Encounter: Payer: Self-pay | Admitting: Internal Medicine

## 2015-04-11 ENCOUNTER — Other Ambulatory Visit (HOSPITAL_COMMUNITY): Payer: Self-pay | Admitting: Orthopedic Surgery

## 2015-04-11 ENCOUNTER — Ambulatory Visit (HOSPITAL_COMMUNITY)
Admission: RE | Admit: 2015-04-11 | Discharge: 2015-04-11 | Disposition: A | Discharge status: Home / Self Care | Payer: BLUE CROSS/BLUE SHIELD | Source: Ambulatory Visit | Attending: Internal Medicine | Admitting: Internal Medicine

## 2015-04-11 DIAGNOSIS — M7989 Other specified soft tissue disorders: Principal | ICD-10-CM

## 2015-04-11 DIAGNOSIS — M79605 Pain in left leg: Secondary | ICD-10-CM

## 2015-04-11 NOTE — Progress Notes (Signed)
Left lower extremity venous duplex completed. No evidence for DVT, SVT, or Baker's cyst. °Brianna L Mazza, RVT °

## 2015-04-18 ENCOUNTER — Telehealth (HOSPITAL_COMMUNITY): Payer: Self-pay | Admitting: *Deleted

## 2015-06-04 ENCOUNTER — Encounter: Payer: Self-pay | Admitting: Internal Medicine

## 2015-06-04 ENCOUNTER — Encounter: Payer: BLUE CROSS/BLUE SHIELD | Admitting: Internal Medicine

## 2015-07-03 NOTE — Progress Notes (Signed)
   Subjective:    Patient ID: William Benson, male    DOB: 1966/11/27, 49 y.o.   MRN: [id number]  HPI  No-show  Review of Systems     Objective:   Physical Exam        Assessment & Plan:   This encounter was created in error - please disregard.

## 2015-07-09 ENCOUNTER — Telehealth: Payer: Self-pay | Admitting: *Deleted

## 2015-07-09 ENCOUNTER — Encounter: Payer: Self-pay | Admitting: Internal Medicine

## 2015-07-09 ENCOUNTER — Ambulatory Visit (INDEPENDENT_AMBULATORY_CARE_PROVIDER_SITE_OTHER): Payer: BLUE CROSS/BLUE SHIELD | Admitting: Internal Medicine

## 2015-07-09 VITALS — BP 110/80 | HR 91 | Temp 98.0°F | Resp 16 | Ht 72.25 in | Wt 187.6 lb

## 2015-07-09 DIAGNOSIS — E785 Hyperlipidemia, unspecified: Secondary | ICD-10-CM | POA: Diagnosis not present

## 2015-07-09 DIAGNOSIS — F17200 Nicotine dependence, unspecified, uncomplicated: Secondary | ICD-10-CM | POA: Insufficient documentation

## 2015-07-09 DIAGNOSIS — Z125 Encounter for screening for malignant neoplasm of prostate: Secondary | ICD-10-CM | POA: Diagnosis not present

## 2015-07-09 DIAGNOSIS — Z720 Tobacco use: Secondary | ICD-10-CM

## 2015-07-09 DIAGNOSIS — Z1211 Encounter for screening for malignant neoplasm of colon: Secondary | ICD-10-CM

## 2015-07-09 LAB — CBC WITH DIFFERENTIAL/PLATELET
BASOS ABS: 0.1 10*3/uL (ref 0.0–0.1)
Basophils Relative: 1 % (ref 0–1)
EOS PCT: 1 % (ref 0–5)
Eosinophils Absolute: 0.1 10*3/uL (ref 0.0–0.7)
HCT: 46.8 % (ref 39.0–52.0)
Hemoglobin: 16.6 g/dL (ref 13.0–17.0)
LYMPHS ABS: 2.2 10*3/uL (ref 0.7–4.0)
Lymphocytes Relative: 25 % (ref 12–46)
MCH: 32.8 pg (ref 26.0–34.0)
MCHC: 35.5 g/dL (ref 30.0–36.0)
MCV: 92.5 fL (ref 78.0–100.0)
MONOS PCT: 7 % (ref 3–12)
MPV: 10.4 fL (ref 8.6–12.4)
Monocytes Absolute: 0.6 10*3/uL (ref 0.1–1.0)
Neutro Abs: 5.7 10*3/uL (ref 1.7–7.7)
Neutrophils Relative %: 66 % (ref 43–77)
Platelets: 298 10*3/uL (ref 150–400)
RBC: 5.06 MIL/uL (ref 4.22–5.81)
RDW: 12.8 % (ref 11.5–15.5)
WBC: 8.7 10*3/uL (ref 4.0–10.5)

## 2015-07-09 MED ORDER — DIAZEPAM 10 MG PO TABS: 10.0000 mg | ORAL_TABLET | Freq: Every evening | ORAL | Status: AC | PRN

## 2015-07-09 MED ORDER — TADALAFIL 10 MG PO TABS: 10.0000 mg | ORAL_TABLET | Freq: Every day | ORAL | Status: AC | PRN

## 2015-07-09 NOTE — Telephone Encounter (Signed)
Called prescription Diazepam 10 mg to patient's pharmacy, spoke to [place] Scientist, research (physical sciences)), per Dr Merla Riches.

## 2015-07-09 NOTE — Progress Notes (Signed)
Subjective:    Patient ID: William Benson, male    DOB: 1966-01-06, 49 y.o.   MRN: [id number]  HPIf/u Patient Active Problem List   Diagnosis Date Noted  . Smoker----has slowed down with vap 07/09/2015  . DJD (degenerative joint disease)----still able to exercise  02/26/2015  . Insomnia----ambien night sweats//no need for meds last 3 weeks 10/31/2013  . Other and unspecified hyperlipidemia 05/02/2013  . Dupuytren's contracture of both hands 02/12/2013  . 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  HENT: Negative for congestion, dental problem, hearing loss, rhinorrhea, trouble swallowing and voice change.   Eyes: Negative for photophobia and visual disturbance.  Respiratory: Negative for apnea, cough, shortness of breath and wheezing.   Cardiovascular: Negative for chest pain, palpitations and leg swelling.  Gastrointestinal: Negative for nausea, vomiting, abdominal pain, diarrhea and constipation.  Genitourinary: Negative for difficulty urinating and testicular pain.       No Nocturia Occasionally uses Cialis and would like another prescription   Musculoskeletal: Negative for myalgias, back pain, joint swelling, arthralgias, gait problem and neck pain.  Skin: Negative for rash.  Allergic/Immunologic: Negative for immunocompromised state.  Neurological: Negative for dizziness, speech difficulty, light-headedness and headaches.  Hematological: Negative for adenopathy. Does not bruise/bleed easily.  Psychiatric/Behavioral: Positive  for sleep disturbance. Negative for behavioral problems and dysphoric mood. The patient is nervous/anxious.        Objective:   Physical Exam BP 110/80 mmHg  Pulse 91  Temp(Src) 98 F (36.7 C) (Oral)  Resp 16  Ht 6' 0.25" (1.835 m)  Wt 187 lb 9.6 oz (85.095 kg)  BMI 25.27 kg/m2 HEENT clear Heart regular without murmur Lungs clear Extremities with full peripheral pulses No edema Neurological intact Mood good affect appropriate and judgment sound  Results for orders placed or performed in visit on 07/09/15  PSA  Result Value Ref Range   PSA 1.14 <=4.00 ng/mL  CBC with Differential/Platelet  Result Value Ref Range   WBC 8.7 4.0 - 10.5 K/uL   RBC 5.06 4.22 - 5.81 MIL/uL   Hemoglobin 16.6 13.0 - 17.0 g/dL   HCT 46.8 39.0 - 52.0 %   MCV 92.5 78.0 - 100.0 fL   MCH 32.8 26.0 - 34.0 pg   MCHC 35.5 30.0 - 36.0 g/dL   RDW 12.8 11.5 - 15.5 %   Platelets 298 150 - 400 K/uL   MPV 10.4 8.6 - 12.4 fL   Neutrophils Relative % 66 43 - 77 %   Neutro Abs 5.7 1.7 - 7.7 K/uL   Lymphocytes Relative 25 12 - 46 %   Lymphs Abs 2.2 0.7 - 4.0 K/uL   Monocytes Relative 7 3 - 12 %   Monocytes Absolute 0.6 0.1 - 1.0 K/uL   Eosinophils Relative 1 0 - 5 %   Eosinophils Absolute 0.1 0.0 - 0.7 K/uL   Basophils Relative 1 0 - 1 %   Basophils Absolute 0.1 0.0 - 0.1 K/uL   Smear Review Criteria for review not met   Comprehensive metabolic panel  Result Value Ref Range   Sodium 138 135 - 146 mEq/L   Potassium  4.3 3.5 - 5.3 mEq/L   Chloride 105 98 - 110 mEq/L   CO2 23 20 - 31 mEq/L   Glucose, Bld 103 (H) 65 - 99 mg/dL   BUN 8 7 - 25 mg/dL   Creat 1.01 0.70 - 1.33 mg/dL   Total Bilirubin 1.2 0.2 - 1.2 mg/dL   Alkaline Phosphatase 56 40 - 115 U/L   AST 23 10 - 35 U/L   ALT 28 9 - 46 U/L   Total Protein 6.6 6.1 - 8.1 g/dL   Albumin 4.5 3.6 - 5.1 g/dL   Calcium 9.6 8.6 - 10.3 mg/dL  Lipid panel  Result Value Ref Range   Cholesterol 193 125 - 200 mg/dL   Triglycerides 155 (H) <150 mg/dL   HDL  45 >=40 mg/dL   Total CHOL/HDL Ratio 4.3 <=5.0 Ratio   VLDL 31 (H) <30 mg/dL   LDL Cholesterol 117 <130 mg/dL        Assessment & Plan:  Hyperlipidemia - Plan: Discussed dietary changes mainly with mild weight loss rather than medication because he has no other illnesses.  Screening for prostate cancer - Plan: PSA  Smoker--discussed the need to quit and described various methods of cessation including what he is doing currently with gradual cut back  Special screening for malignant neoplasms, colon - Plan: Ambulatory referral to Gastroenterology  Mild ED  Meds ordered this encounter  Medications  . diazepam (VALIUM) 10 MG tablet    Sig: Take 1 tablet (10 mg total) by mouth at bedtime as needed for anxiety.    Dispense:  30 tablet    Refill:  5  . tadalafil (CIALIS) 10 MG tablet    Sig: Take 1 tablet (10 mg total) by mouth daily as needed for erectile dysfunction.    Dispense:  10 tablet    Refill:  11   may call for refills ambien/uses rarely

## 2015-07-10 LAB — LIPID PANEL
Cholesterol: 193 mg/dL (ref 125–200)
HDL: 45 mg/dL (ref >=40)
LDL Cholesterol: 117 mg/dL (ref <130)
TRIGLYCERIDES: 155 mg/dL — AB (ref <150)
Total CHOL/HDL Ratio: 4.3 Ratio (ref <=5.0)
VLDL: 31 mg/dL — AB (ref <30)

## 2015-07-10 LAB — COMPREHENSIVE METABOLIC PANEL
ALK PHOS: 56 U/L (ref 40–115)
ALT: 28 U/L (ref 9–46)
AST: 23 U/L (ref 10–35)
Albumin: 4.5 g/dL (ref 3.6–5.1)
BUN: 8 mg/dL (ref 7–25)
CO2: 23 meq/L (ref 20–31)
Calcium: 9.6 mg/dL (ref 8.6–10.3)
Chloride: 105 mEq/L (ref 98–110)
Creat: 1.01 mg/dL (ref 0.70–1.33)
Glucose, Bld: 103 mg/dL — ABNORMAL HIGH (ref 65–99)
POTASSIUM: 4.3 meq/L (ref 3.5–5.3)
Sodium: 138 mEq/L (ref 135–146)
Total Bilirubin: 1.2 mg/dL (ref 0.2–1.2)
Total Protein: 6.6 g/dL (ref 6.1–8.1)

## 2015-07-10 LAB — PSA: PSA: 1.14 ng/mL (ref <=4.00)

## 2015-07-12 ENCOUNTER — Encounter: Payer: Self-pay | Admitting: Internal Medicine

## 2015-08-08 ENCOUNTER — Encounter: Payer: Self-pay | Admitting: Internal Medicine

## 2015-10-27 ENCOUNTER — Ambulatory Visit (INDEPENDENT_AMBULATORY_CARE_PROVIDER_SITE_OTHER): Payer: BLUE CROSS/BLUE SHIELD | Admitting: Internal Medicine

## 2015-10-27 VITALS — BP 112/72 | HR 86 | Temp 97.9°F | Resp 16 | Ht 73.5 in | Wt 203.2 lb

## 2015-10-27 DIAGNOSIS — H6991 Unspecified Eustachian tube disorder, right ear: Secondary | ICD-10-CM

## 2015-10-27 NOTE — Progress Notes (Signed)
Patient ID: William Benson, male   DOB: 11-10-1966, 49 y.o.   MRN: [id number]   10/27/2015 at 8:49 AM  William Benson / DOB: 11-10-1966 / MRN: [id number]  Problem list reviewed and updated by me where necessary.   SUBJECTIVE  William RenoJimmy R Swallow is a 49 y.o. well appearing male presenting for the chief complaint of water in the right ear..     He  has no past medical history on file.    Medications reviewed and updated by myself where necessary, and exist elsewhere in the encounter.   William Benson is allergic to oxycodone. He  reports that he has been smoking Cigarettes.  He has been smoking about 0.50 packs per day. He has never used smokeless tobacco. He reports that he drinks about 3.6 oz of alcohol per week. He reports that he does not use illicit drugs. He  reports that he currently engages in sexual activity. The patient  has past surgical history that includes Hand surgery.  His family history includes Diabetes in his father; Heart disease in his mother. He complains of a feeling of water in his right ear for 1 month. He states he has not been swimming since August. Both ear drums appear normal. No complaint of auditory disturbances or vertigo. Right ear appears hairy. He takes zyrtec daily for allergies.   Review of Systems  Constitutional: Negative.  Negative for fever.  HENT: Negative for congestion, ear discharge, ear pain, hearing loss and tinnitus.   Respiratory: Negative.  Negative for shortness of breath.   Cardiovascular: Negative for chest pain.  Gastrointestinal: Negative for nausea.  Skin: Negative for rash.  Neurological: Negative for dizziness, tingling, sensory change and headaches.    OBJECTIVE  His  height is 6' 1.5" (1.867 m) and weight is 203 lb 3.2 oz (92.171 kg). His oral temperature is 97.9 F (36.6 C). His blood pressure is 112/72 and his pulse is 86. His respiration is 16 and oxygen saturation is 95%.  The patient's body mass index is 26.44  kg/(m^2).  Physical Exam  Constitutional: He is oriented to person, place, and time. He appears well-developed and well-nourished. No distress.  HENT:  Head: Normocephalic.  Right Ear: External ear normal.  Left Ear: External ear normal.  Nose: Nose normal.  Mouth/Throat: Oropharynx is clear and moist.  Eyes: Conjunctivae and EOM are normal. Pupils are equal, round, and reactive to light.  Neck: Normal range of motion.  Respiratory: Effort normal.  Lymphadenopathy:    He has no cervical adenopathy.  Neurological: He is alert and oriented to person, place, and time. He exhibits normal muscle tone. Coordination normal.  Psychiatric: He has a normal mood and affect.    No results found for this or any previous visit (from the past 24 hour(s)).  ASSESSMENT & PLAN  William Benson was seen today for ear problem.  Diagnoses and all orders for this visit:  Eustachian tube disorder, right -     Ambulatory referral to ENT Sudafed and ear care

## 2015-10-27 NOTE — Patient Instructions (Signed)

## 2015-11-10 ENCOUNTER — Encounter: Payer: Self-pay | Admitting: Internal Medicine

## 2016-01-14 ENCOUNTER — Encounter: Payer: Self-pay | Admitting: Internal Medicine

## 2016-01-14 ENCOUNTER — Ambulatory Visit (INDEPENDENT_AMBULATORY_CARE_PROVIDER_SITE_OTHER): Payer: BLUE CROSS/BLUE SHIELD | Admitting: Internal Medicine

## 2016-01-14 VITALS — BP 114/67 | HR 87 | Temp 97.8°F | Resp 16 | Ht 73.0 in | Wt 199.0 lb

## 2016-01-14 DIAGNOSIS — E785 Hyperlipidemia, unspecified: Secondary | ICD-10-CM | POA: Diagnosis not present

## 2016-01-14 LAB — LIPID PANEL
CHOL/HDL RATIO: 5.7 ratio — AB (ref <=5.0)
Cholesterol: 193 mg/dL (ref 125–200)
HDL: 34 mg/dL — ABNORMAL LOW (ref >=40)
LDL Cholesterol: 131 mg/dL — ABNORMAL HIGH (ref <130)
Triglycerides: 140 mg/dL (ref <150)
VLDL: 28 mg/dL (ref <30)

## 2016-01-14 NOTE — Progress Notes (Signed)
   Subjective:    Patient ID: William Benson, male    DOB: 1966-02-13, 50 y.o.   MRN: [id number]  HPIfu hyperlip borderline Patient Active Problem List   Diagnosis Date Noted  . Smoker---cutting back--2017 the yr to quit 07/09/2015  . DJD (degenerative joint disease) 02/26/2015  . Insomnia---much impr--rare meds 10/31/2013  . Other and unspecified hyperlipidemia 05/02/2013  . Dupuytren's contracture of both hands 02/12/2013  . 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   Eyes: Negative for visual disturbance.  Respiratory: Negative for chest tightness and shortness of breath.   Cardiovascular: Negative for chest pain and palpitations.  Gastrointestinal: Negative for abdominal pain.  Genitourinary: Negative for difficulty urinating.  Neurological: Negative for headaches.  Psychiatric/Behavioral: Negative for dysphoric mood.       Objective:   Physical Exam  Constitutional: He is oriented to person, place, and time. He appears well-developed and well-nourished. No distress.  HENT:  Head: Normocephalic and atraumatic.  Eyes: Pupils are equal, round, and reactive to light.  Neck: Normal range of motion.  Cardiovascular: Normal rate and regular rhythm.   Pulmonary/Chest: Effort normal. No respiratory distress.  Musculoskeletal: Normal range of motion.  Neurological: He is alert and oriented to person, place, and time.  Skin: Skin is warm and dry.  Psychiatric: He has a normal mood and affect. His behavior is normal.  Nursing note and vitals reviewed. BP 114/67 mmHg  Pulse 87  Temp(Src) 97.8 F (36.6 C)  Resp 16  Ht  (1.854 m)  Wt 199 lb (90.266 kg)  BMI 26.26 kg/m2     Assessment & Plan:  Hyperlipidemia - Plan: Lipid panel will do colonos this yr  as referred last yr Quit smoking!!!!!!  No meds needed  [facility] or [facility] 6 m

## 2016-01-14 NOTE — Patient Instructions (Signed)
Ulysees Barns Deboraha Sprang FNP

## 2016-07-14 ENCOUNTER — Ambulatory Visit: Payer: BLUE CROSS/BLUE SHIELD | Admitting: Physician Assistant

## 2018-03-14 ENCOUNTER — Ambulatory Visit (INDEPENDENT_AMBULATORY_CARE_PROVIDER_SITE_OTHER): Payer: BLUE CROSS/BLUE SHIELD | Admitting: Urgent Care

## 2018-03-14 ENCOUNTER — Ambulatory Visit (INDEPENDENT_AMBULATORY_CARE_PROVIDER_SITE_OTHER): Payer: BLUE CROSS/BLUE SHIELD

## 2018-03-14 ENCOUNTER — Encounter: Payer: Self-pay | Admitting: Urgent Care

## 2018-03-14 VITALS — BP 126/74 | HR 79 | Temp 98.1°F | Resp 16 | Ht 73.0 in | Wt 202.2 lb

## 2018-03-14 DIAGNOSIS — M545 Low back pain: Secondary | ICD-10-CM

## 2018-03-14 DIAGNOSIS — Z9889 Other specified postprocedural states: Secondary | ICD-10-CM

## 2018-03-14 DIAGNOSIS — G8929 Other chronic pain: Secondary | ICD-10-CM

## 2018-03-14 DIAGNOSIS — M546 Pain in thoracic spine: Secondary | ICD-10-CM

## 2018-03-14 DIAGNOSIS — Z789 Other specified health status: Secondary | ICD-10-CM

## 2018-03-14 DIAGNOSIS — B001 Herpesviral vesicular dermatitis: Secondary | ICD-10-CM

## 2018-03-14 DIAGNOSIS — M5136 Other intervertebral disc degeneration, lumbar region: Secondary | ICD-10-CM

## 2018-03-14 DIAGNOSIS — Z7289 Other problems related to lifestyle: Secondary | ICD-10-CM

## 2018-03-14 DIAGNOSIS — M5137 Other intervertebral disc degeneration, lumbosacral region: Secondary | ICD-10-CM

## 2018-03-14 MED ORDER — VALACYCLOVIR HCL 1 G PO TABS: 1000.0000 mg | ORAL_TABLET | Freq: Every day | ORAL | 5 refills | Status: AC

## 2018-03-14 MED ORDER — CYCLOBENZAPRINE HCL 5 MG PO TABS: 5.0000 mg | ORAL_TABLET | Freq: Every day | ORAL | 5 refills | Status: AC

## 2018-03-14 MED ORDER — TIZANIDINE HCL 4 MG PO TABS: 4.0000 mg | ORAL_TABLET | Freq: Three times a day (TID) | ORAL | 5 refills | Status: AC | PRN

## 2018-03-14 MED ORDER — MELOXICAM 7.5 MG PO TABS: 7.5000 mg | ORAL_TABLET | Freq: Every day | ORAL | 0 refills | Status: AC

## 2018-03-14 NOTE — Patient Instructions (Signed)
Back Pain, Adult Back pain is very common. The pain often gets better over time. The cause of back pain is usually not dangerous. Most people can learn to manage their back pain on their own. Follow these instructions at home: Watch your back pain for any changes. The following actions may help to lessen any pain you are feeling:  Stay active. Start with short walks on flat ground if you can. Try to walk farther each day.  Exercise regularly as told by your doctor. Exercise helps your back heal faster. It also helps avoid future injury by keeping your muscles strong and flexible.  Do not sit, drive, or stand in one place for more than 30 minutes.  Do not stay in bed. Resting more than 1-2 days can slow down your recovery.  Be careful when you bend or lift an object. Use good form when lifting: ? Bend at your knees. ? Keep the object close to your body. ? Do not twist.  Sleep on a firm mattress. Lie on your side, and bend your knees. If you lie on your back, put a pillow under your knees.  Take medicines only as told by your doctor.  Put ice on the injured area. ? Put ice in a plastic bag. ? Place a towel between your skin and the bag. ? Leave the ice on for 20 minutes, 2-3 times a day for the first 2-3 days. After that, you can switch between ice and heat packs.  Avoid feeling anxious or stressed. Find good ways to deal with stress, such as exercise.  Maintain a healthy weight. Extra weight puts stress on your back.  Contact a doctor if:  You have pain that does not go away with rest or medicine.  You have worsening pain that goes down into your legs or buttocks.  You have pain that does not get better in one week.  You have pain at night.  You lose weight.  You have a fever or chills. Get help right away if:  You cannot control when you poop (bowel movement) or pee (urinate).  Your arms or legs feel weak.  Your arms or legs lose feeling (numbness).  You feel sick  to your stomach (nauseous) or throw up (vomit).  You have belly (abdominal) pain.  You feel like you may pass out (faint). This information is not intended to replace advice given to you by your health care provider. Make sure you discuss any questions you have with your health care provider. Document Released: 05/17/2008 Document Revised: 05/06/2016 Document Reviewed: 04/02/2014 Elsevier Interactive Patient Education  2018 Elsevier Inc.    IF you received an x-ray today, you will receive an invoice from [facility]. Please contact [facility] at [phone] with questions or concerns regarding your invoice.   IF you received labwork today, you will receive an invoice from [facility]. Please contact [facility] at [phone] with questions or concerns regarding your invoice.   Our billing staff will not be able to assist you with questions regarding bills from these companies.  You will be contacted with the lab results as soon as they are available. The fastest way to get your results is to activate your My Chart account. Instructions are located on the last page of this paperwork. If you have not heard from us regarding the results in 2 weeks, please contact this office.      

## 2018-03-14 NOTE — Progress Notes (Signed)
MRN: [id number] DOB: 1966/05/12  Subjective:   William Benson is a 52 y.o. male presenting for 3 month history of recurrent mid-left side back pain. Pain is sharp/stabbing just to the left of the spine over mid-low back. Has tried ibuprofen consistently. Denies recent trauma, falls, incontinence, weakness, difficulty urinating, hematuria, numbness or tingling, radicular pain. Patient works in [employer]eron[employer]ical [employer]sc[employer], also does extra 12 hour shifts but has to stand the entire shift. Has a history of arthritis, pinched nerve, had surgery in 2016 with Dr. Shon BatonBrooks. Smokes <1ppd for the past 20 years. Has ~10 alcohol drinks per week. He hydrates very well. Patient is requesting refills on his Valium, Ambien that Dr. Merla Richesoolittle used to prescribe to him.   William Benson has a current medication list which includes the following prescription(s): cetirizine, diazepam, lansoprazole, lysine hcl, potassium, tadalafil, valacyclovir, and zolpidem. Also is allergic to oxycodone.  William Benson  has a past medical history of DDD (degenerative disc disease), lumbosacral. Also  has a past surgical history that includes Hand surgery.  Objective:   Vitals: BP 126/74   Pulse 79   Temp 98.1 F (36.7 C) (Oral)   Resp 16   Ht 6\' 1"  (1.854 m)   Wt 202 lb 3.2 oz (91.7 kg)   SpO2 97%   BMI 26.68 kg/m   Physical Exam  Constitutional: He is oriented to person, place, and time. He appears well-developed and well-nourished.  HENT:  Mouth/Throat: Oropharynx is clear and moist.  Cardiovascular: Normal rate.  Pulmonary/Chest: Effort normal.  Musculoskeletal:       Thoracic back: He exhibits tenderness (over area depicted). He exhibits normal range of motion, no bony tenderness, no swelling, no edema, no deformity and no spasm.       Lumbar back: He exhibits normal range of motion, no tenderness, no bony tenderness, no swelling, no edema, no deformity and no spasm.       Back:  Neurological: He is alert and oriented to person, place, and  time.  Psychiatric: He has a normal mood and affect.   Dg Thoracic Spine 2 View  Result Date: 03/14/2018 CLINICAL DATA:  Dorsalgia EXAM: THORACIC SPINE 2 VIEWS COMPARISON:  None. FINDINGS: Frontal and lateral views were obtained. There is no fracture or spondylolisthesis. There are anterior and lateral osteophytes in the lower thoracic region. There is mild disc space narrowing at several levels. No erosive change or paraspinous lesion. 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 No fracture or spondylolisthesis. Electronically Signed   By: Bretta BangWilliam  Woodruff III M.D.   On: 03/14/2018 14:35   Dg Lumbar Spine Complete  Result Date: 03/14/2018 CLINICAL DATA:  Chronic lumbago EXAM: LUMBAR SPINE - COMPLETE 4+ VIEW COMPARISON:  February 26, 2015 FINDINGS: Frontal, lateral, spot lumbosacral lateral, and bilateral oblique views were obtained. There are 5 non-rib-bearing lumbar type vertebral bodies. There is no appreciable fracture or spondylolisthesis. There is moderately severe disc space narrowing at L5-S1. Other disc spaces appear normal. There are small anterior osteophytes at all levels. There is calcification in the anterior ligament at L1-2. There is facet osteoarthritic change at L3-4, L4-5, and L5-S1 bilaterally. There are foci of aortic atherosclerosis. IMPRESSION: Osteoarthritic changes several levels, most notably at L5-S1. no fracture or spondylolisthesis. There is aortic atherosclerosis. Aortic Atherosclerosis (ICD10-I70.0). Electronically Signed   By: Bretta BangWilliam  Woodruff III M.D.   On: 03/14/2018 14:34   Assessment and Plan :   Acute left-sided thoracic back pain - Plan: DG Lumbar Spine Complete, DG Thoracic Spine  2 View  Degenerative disc disease at L5-S1 level - Plan: Ambulatory referral to Orthopedic Surgery  Degenerative disc disease, lumbar - Plan: Ambulatory referral to Orthopedic Surgery  History of back surgery - Plan: DG Lumbar Spine Complete  Recurrent cold  sores  Alcohol use  Discussed his progressive arthritis and management. Start meloxicam. Refilled his Flexeril, Xanaflex and emphasized drowsiness as a potential for drowsiness. I refused Ambien and Valium as it is not indicated for treatment of arthritis. Also discussed hazards of using both Ambien and Valium with his alcohol use. Patient verbalized understanding but was displeased with not getting these medications. He did admit that Flexeril can make him sleepy. Offered him trazodone, Vistaril but he declined. Referral to Dr. Shon Baton is pending. Return-to-clinic precautions discussed, patient verbalized understanding.   Wallis Bamberg, PA-C Primary [facility] at [facility] [phone] 03/14/2018  2:18 PM

## 2019-09-27 IMAGING — DX DG THORACIC SPINE 2V
2 series · 2 of 2 positions shown · non-contrast
Comparison: None.

CLINICAL DATA: Dorsalgia

EXAM:
THORACIC SPINE 2 VIEWS

[t-spine ap]
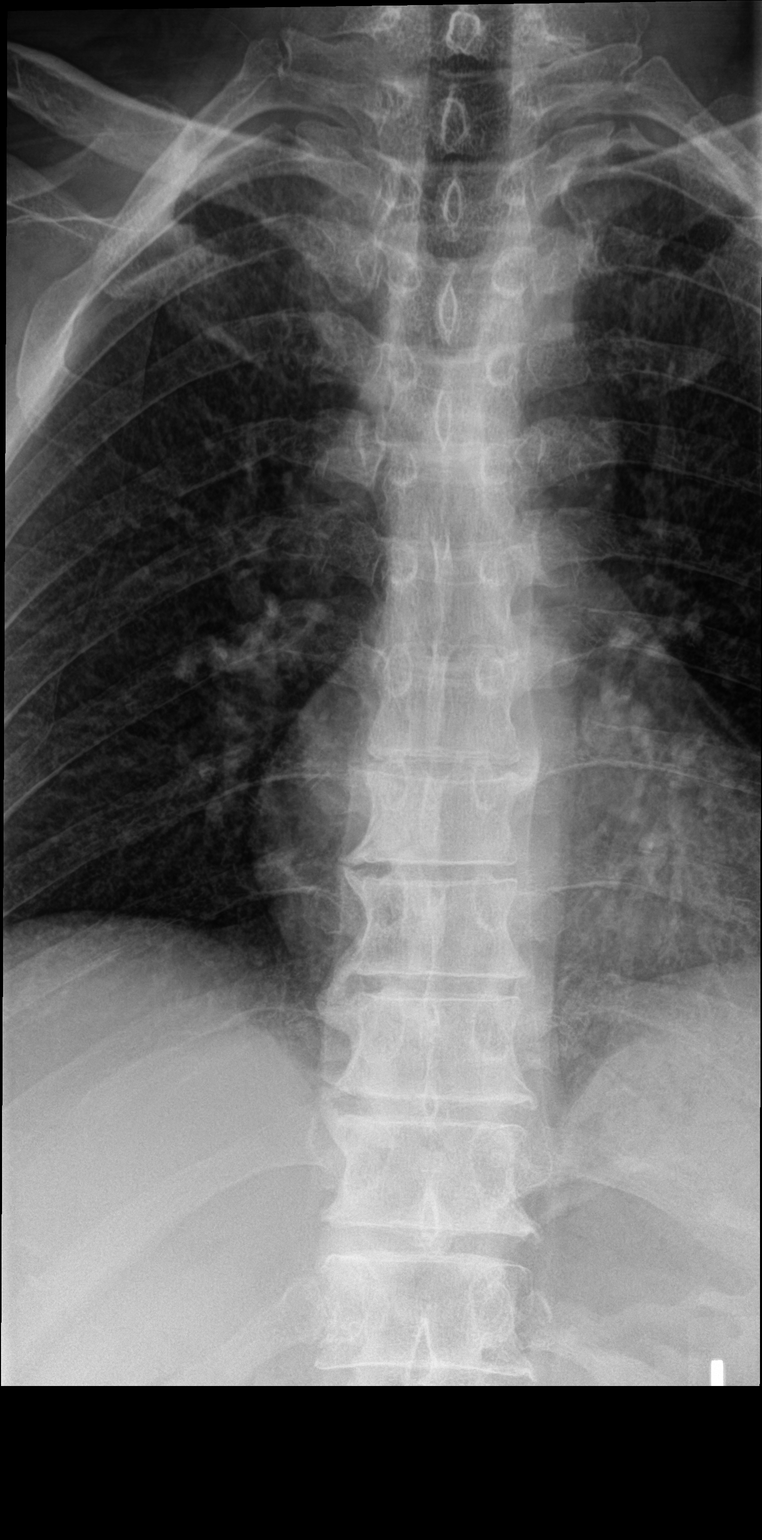

[t-spine lat]
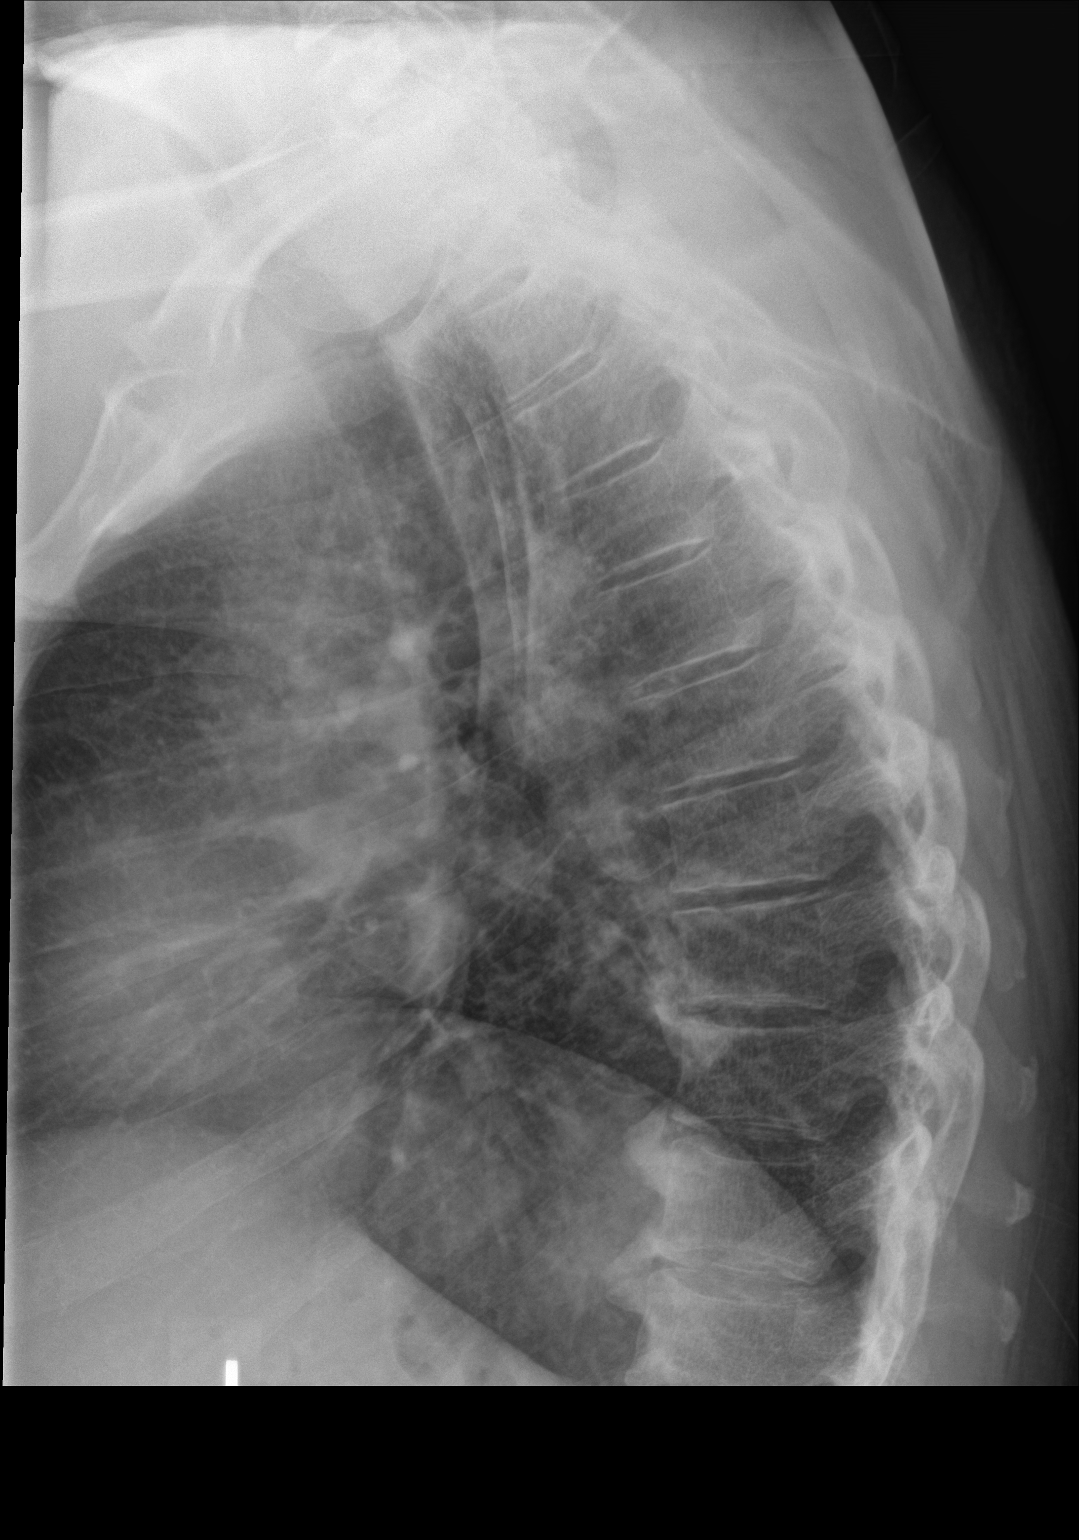

[2 of 2 positions shown; findings below may reference images not displayed]

FINDINGS: Frontal and lateral views were obtained. There is no fracture or
spondylolisthesis. There are anterior and lateral osteophytes in the
lower thoracic region. There is mild disc space narrowing at several
levels. No erosive change or paraspinous lesion.
IMPRESSION: Osteoarthritic change in the lower thoracic region. No fracture or
spondylolisthesis.

## 2019-09-27 IMAGING — DX DG LUMBAR SPINE COMPLETE 4+V
5 series · 5 of 5 positions shown · non-contrast
Comparison: February 26, 2015

CLINICAL DATA: Chronic lumbago

EXAM:
LUMBAR SPINE - COMPLETE 4+ VIEW

[l-spine ap]
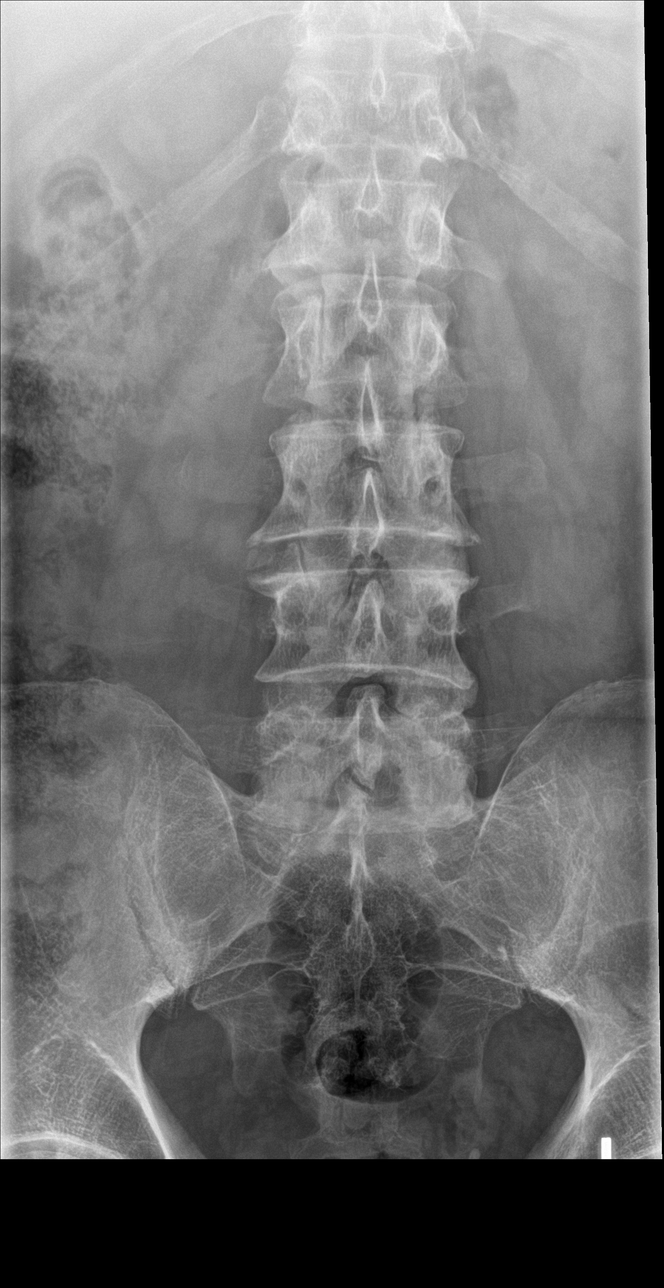

[l-spine obl (1 of 2)]
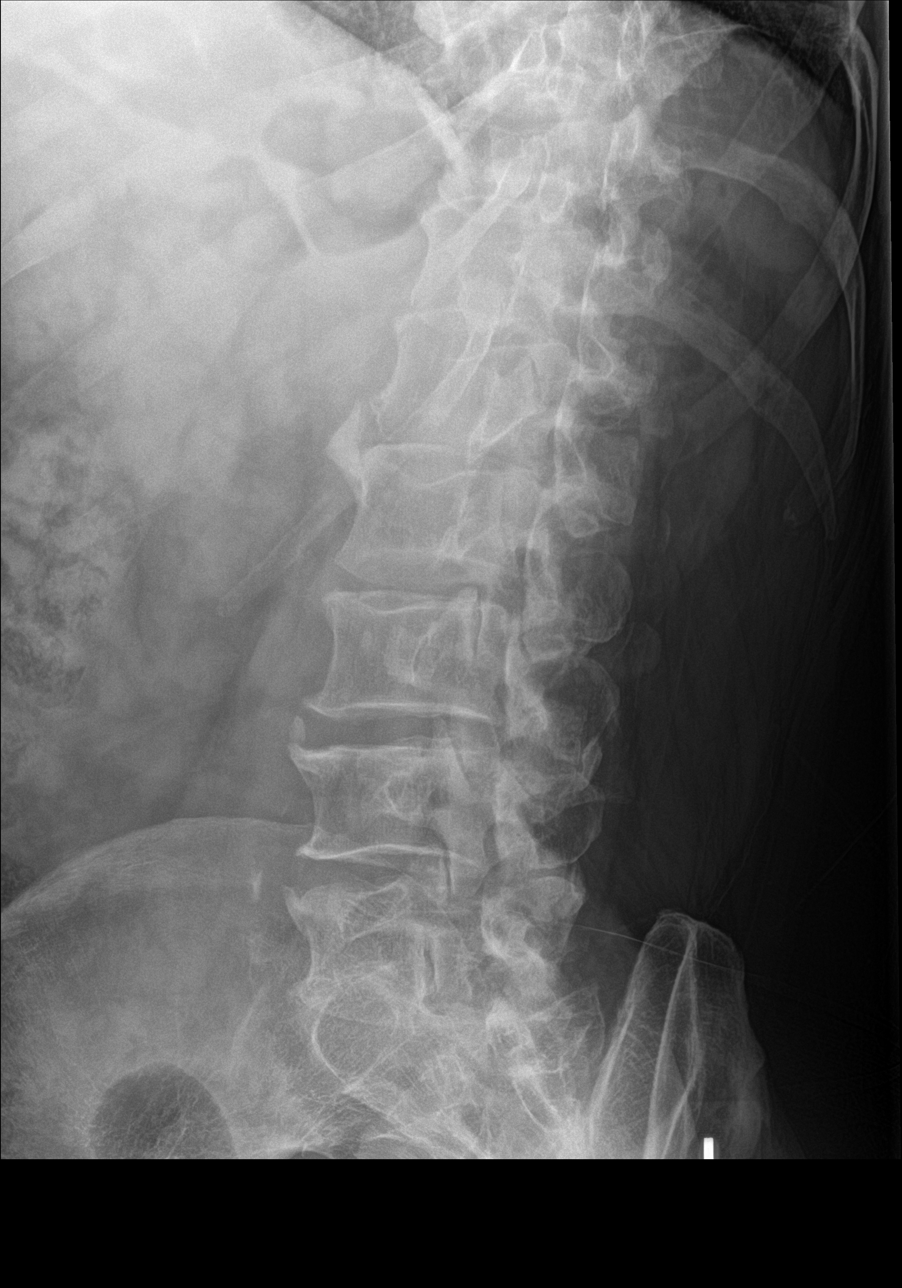

[l-spine obl (2 of 2)]
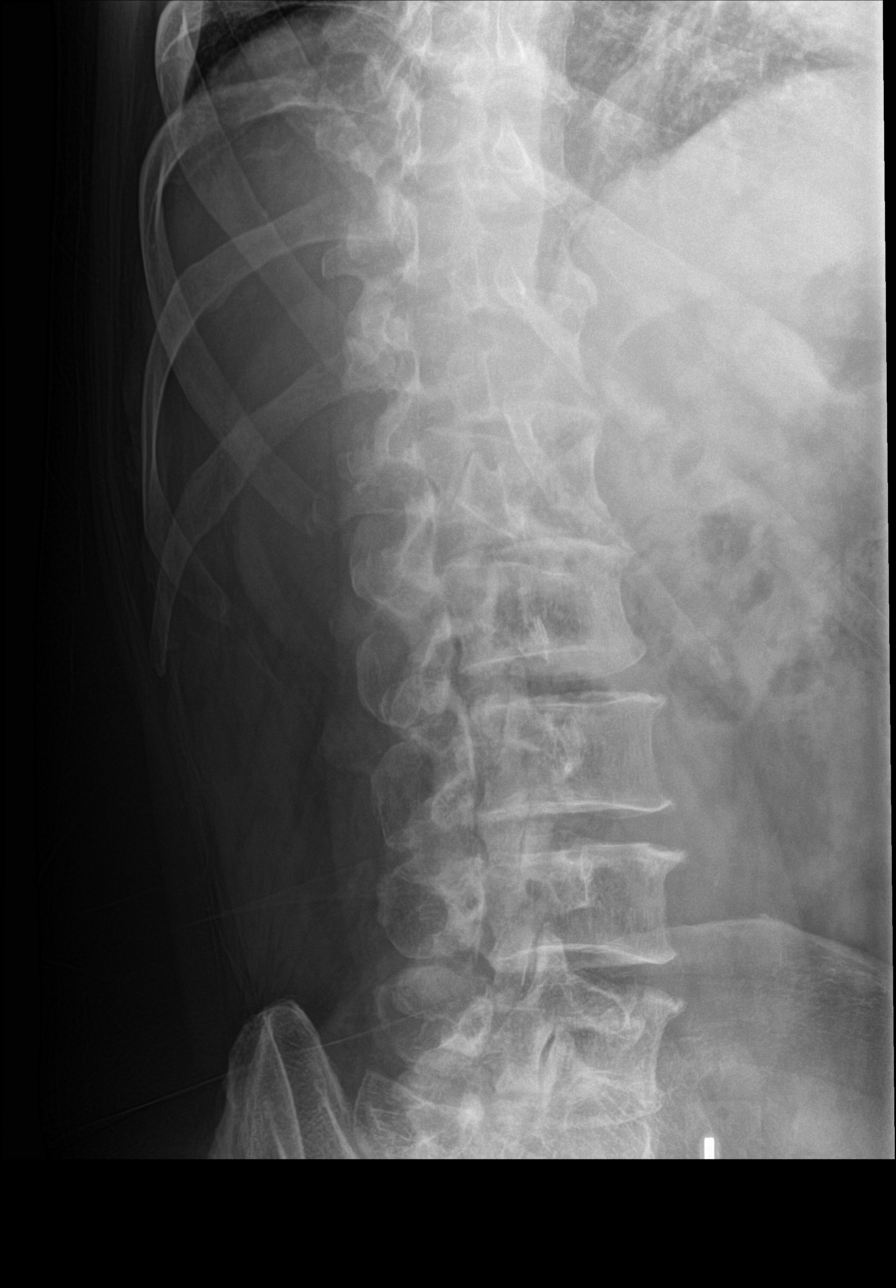

[l-spine lat]
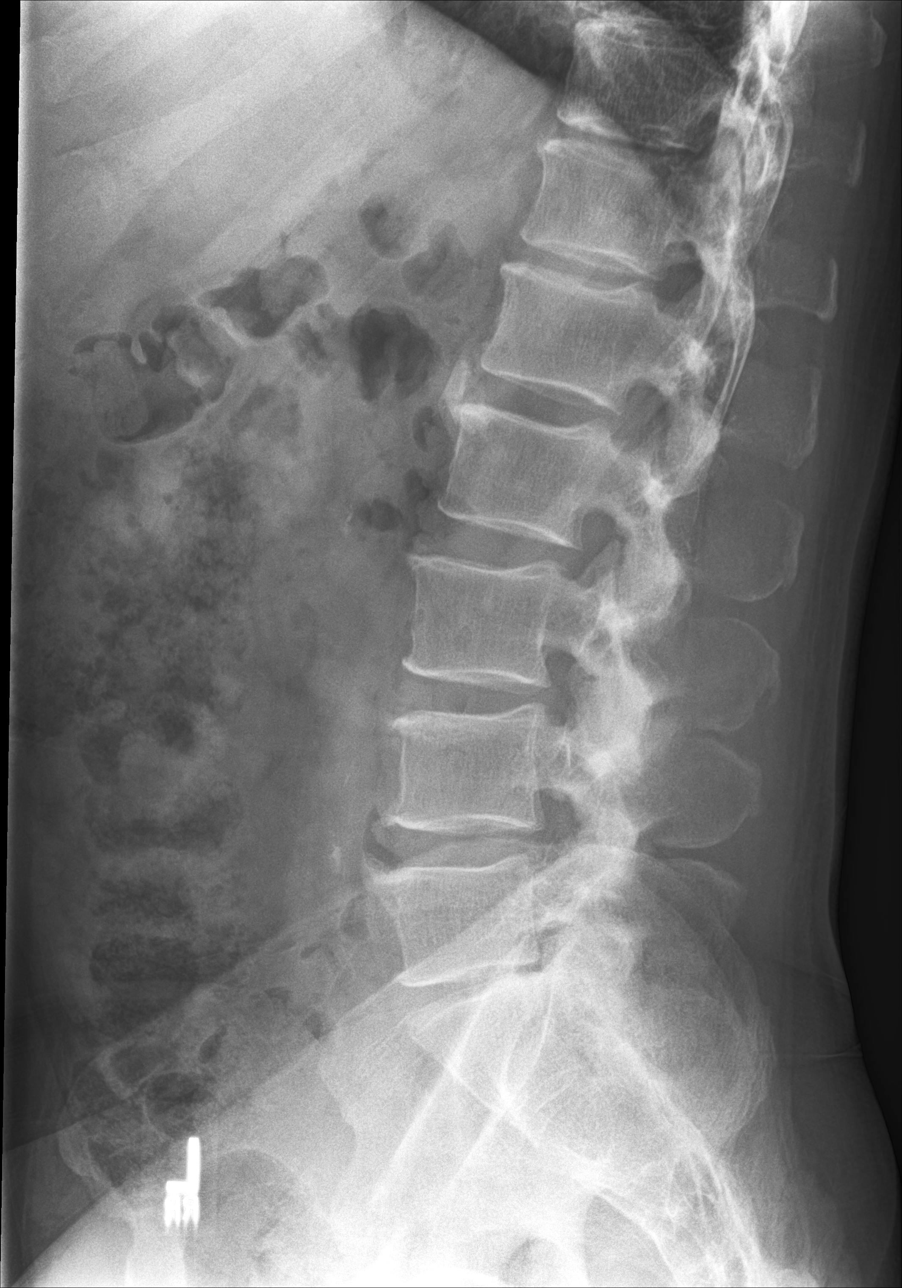

[l-spine l5-s1]
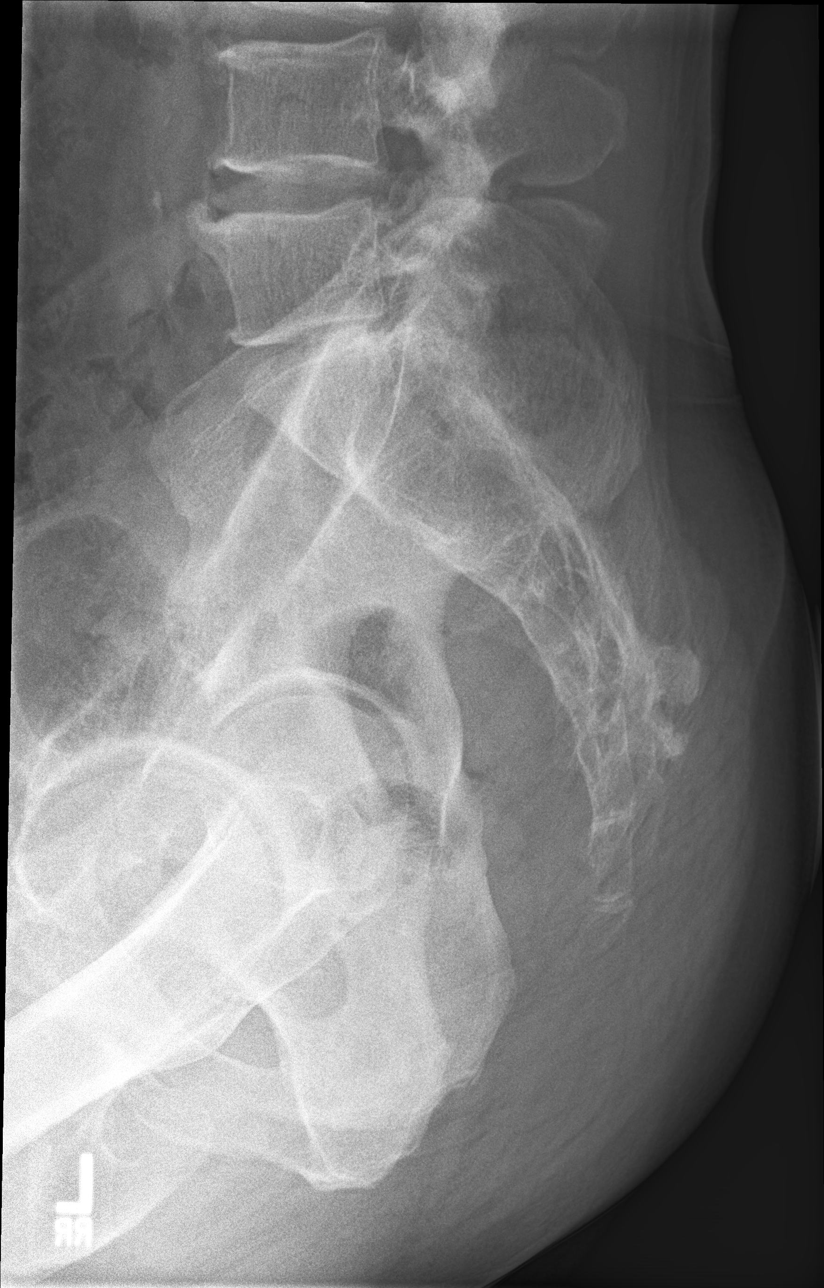

[5 of 5 positions shown; findings below may reference images not displayed]

FINDINGS: Frontal, lateral, spot lumbosacral lateral, and bilateral oblique
views were obtained. There are 5 non-rib-bearing lumbar type
vertebral bodies. There is no appreciable fracture or
spondylolisthesis. There is moderately severe disc space narrowing
at L5-S1. Other disc spaces appear normal. There are small anterior
osteophytes at all levels. There is calcification in the anterior
ligament at L1-2. There is facet osteoarthritic change at L3-4,
L4-5, and L5-S1 bilaterally. There are foci of aortic
atherosclerosis.
IMPRESSION: Osteoarthritic changes several levels, most notably at L5-S1. no
fracture or spondylolisthesis. There is aortic atherosclerosis.

Aortic Atherosclerosis ([id number].D).

## 2024-08-29 ENCOUNTER — Ambulatory Visit: Admitting: Internal Medicine

## 2024-08-29 ENCOUNTER — Other Ambulatory Visit: Payer: Self-pay

## 2024-08-29 ENCOUNTER — Encounter: Payer: Self-pay | Admitting: Internal Medicine

## 2024-08-29 VITALS — BP 122/84 | HR 70 | Temp 98.3°F | Ht 72.5 in | Wt 196.4 lb

## 2024-08-29 DIAGNOSIS — J3089 Other allergic rhinitis: Secondary | ICD-10-CM | POA: Diagnosis not present

## 2024-08-29 DIAGNOSIS — J343 Hypertrophy of nasal turbinates: Secondary | ICD-10-CM | POA: Diagnosis not present

## 2024-08-29 MED ORDER — FLUTICASONE PROPIONATE 50 MCG/ACT NA SUSP: 2.0000 | Freq: Every day | NASAL | 5 refills | Status: AC

## 2024-08-29 MED ORDER — CETIRIZINE HCL 10 MG PO TABS: 10.0000 mg | ORAL_TABLET | Freq: Every day | ORAL | 5 refills | Status: AC

## 2024-08-29 NOTE — Progress Notes (Signed)
 NEW PATIENT  Date of Service/Encounter:  08/29/24  Consult requested by: Patient, No Pcp Per   Subjective:   William Benson (DOB: August 28, 1966) is a 58 y.o. male who presents to the clinic on 08/29/2024 with a chief complaint of Allergic Rhinitis  (Referred for allergies, long history of allergies and would like allergy testing.) .    History obtained from: chart review and patient.   Rhinitis:  Started around high school.   Symptoms include: nasal congestion, rhinorrhea, post nasal drainage, and watery eyes  Occurs year-round Potential triggers: pollen- works in [employer]rap[employer], [employer] care   Treatments tried:  Benadryl PRN Zyrtec  daily   Previous allergy testing: yes possibly in childhood but can't recall  History of sinus surgery: no Nonallergic triggers: none     Reviewed:   07/26/2024: seen by Dr Jesus for chronic rhinitis, hearing loss, anosmia. Thought to be allergic in nature, referred to Allergy. Continue Zyrtec .  CT sinus 08/03/2024:IMPRESSION: 1. Generally hyperplastic and well aerated paranasal sinuses. Minor anterior ethmoid, right maxillary sinus mucosal thickening, with occasional bubbly opacity. Patent sinus drainage pathways. 2. Symmetric nasal cavity mucosal thickening and mild retained secretions suggesting Rhinitis. Mild rightward septal deviation.   08/30/2023: seen by Cardiology for HLD and fatigue, planning to check lipids, on ASA.    Past Medical History: Past Medical History:  Diagnosis Date   DDD (degenerative disc disease), lumbosacral    Past Surgical History: Past Surgical History:  Procedure Laterality Date   BACK SURGERY  03/2015   HAND SURGERY      Family History: Family History  Problem Relation Age of Onset   Allergic rhinitis Mother    Heart disease Mother    Diabetes Father     Social History:  Flooring in bedroom: wood Pets: cat and dog Tobacco use/exposure: 1980s, 1ppd Job: lawn care   Medication List:  Allergies as of  08/29/2024       Reactions   Oxycodone Rash        Medication List        Accurate as of August 29, 2024  2:08 PM. If you have any questions, ask your nurse or doctor.          cetirizine  10 MG tablet Commonly known as: ZYRTEC  Take 10 mg by mouth daily.   cyclobenzaprine  5 MG tablet Commonly known as: FLEXERIL  Take 1 tablet (5 mg total) by mouth at bedtime.   diazepam  10 MG tablet Commonly known as: VALIUM  Take 1 tablet (10 mg total) by mouth at bedtime as needed for anxiety.   lansoprazole 15 MG capsule Commonly known as: PREVACID Take 15 mg by mouth daily.   Lysine HCl 1000 MG Tabs Take by mouth.   meloxicam  7.5 MG tablet Commonly known as: MOBIC  Take 1 tablet (7.5 mg total) by mouth daily.   POTASSIUM PO Take by mouth.   tadalafil  10 MG tablet Commonly known as: Cialis  Take 1 tablet (10 mg total) by mouth daily as needed for erectile dysfunction.   tiZANidine  4 MG tablet Commonly known as: Zanaflex  Take 1 tablet (4 mg total) by mouth every 8 (eight) hours as needed for muscle spasms.   valACYclovir  1000 MG tablet Commonly known as: VALTREX  Take 1 tablet (1,000 mg total) by mouth daily. What changed: additional instructions   zolpidem  10 MG tablet Commonly known as: AMBIEN  TAKE 1 TABLET BY MOUTH EVERY NIGHT AT BEDTIME AS NEEDED FOR SLEEP         REVIEW OF SYSTEMS: Pertinent positives  and negatives discussed in HPI.   Objective:   Physical Exam: BP 122/84 (BP Location: Left Arm, Patient Position: Sitting, Cuff Size: Normal)   Pulse 70   Temp 98.3 F (36.8 C) (Temporal)   Ht 6' 0.5 (1.842 m)   Wt 196 lb 6.4 oz (89.1 kg)   SpO2 96%   BMI 26.27 kg/m  Body mass index is 26.27 kg/m. GEN: alert, well developed HEENT: clear conjunctiva, nose with + mild inferior turbinate hypertrophy, pink nasal mucosa, slight clear rhinorrhea, + cobblestoning HEART: regular rate and rhythm, no murmur LUNGS: clear to auscultation bilaterally, no  coughing, unlabored respiration ABDOMEN: soft, non distended  SKIN: no rashes or lesions  Assessment:   1. Other allergic rhinitis   2. Nasal turbinate hypertrophy     Plan/Recommendations:  Other Allergic Rhinitis: - Due to turbinate hypertrophy and unresponsive to over the counter meds, will perform skin testing to identify aeroallergen triggers.   - Use nasal saline rinses before nose sprays such as with Neilmed Sinus Rinse.  Use distilled water.   - Use Flonase  2 sprays each nostril daily. Aim upward and outward. - Use Zyrtec  10 mg daily.   Hold all anti-histamines (Xyzal, Allegra, Zyrtec , Claritin, Benadryl, Pepcid) 3 days prior to next visit.   Follow up: 9/24 at 130 for skin testing 1-55, IDs okay      No follow-ups on file.  Arleta Blanch, MD [facility] 

## 2024-08-29 NOTE — Patient Instructions (Addendum)
 Other Allergic Rhinitis: - Use nasal saline rinses before nose sprays such as with Neilmed Sinus Rinse.  Use distilled water.   - Use Flonase  2 sprays each nostril daily. Aim upward and outward. - Use Zyrtec  10 mg daily.   Hold all anti-histamines (Xyzal, Allegra, Zyrtec , Claritin, Benadryl, Pepcid) 3 days prior to next visit.  Follow up: 9/24 at 130 for skin testing 1-55 with me

## 2024-09-05 ENCOUNTER — Ambulatory Visit: Admitting: Internal Medicine

## 2024-09-05 ENCOUNTER — Telehealth: Payer: Self-pay | Admitting: Internal Medicine

## 2024-09-05 DIAGNOSIS — J301 Allergic rhinitis due to pollen: Secondary | ICD-10-CM

## 2024-09-05 DIAGNOSIS — J3089 Other allergic rhinitis: Secondary | ICD-10-CM

## 2024-09-05 MED ORDER — AZELASTINE HCL 0.1 % NA SOLN: 2.0000 | Freq: Two times a day (BID) | NASAL | 5 refills | Status: DC | PRN

## 2024-09-05 NOTE — Patient Instructions (Addendum)
 Allergic Rhinitis: - Positive skin test 08/2024: trees, grasses, weeds, molds,  - Use nasal saline rinses before nose sprays such as with Neilmed Sinus Rinse.  Use distilled water.   - Use Flonase  2 sprays each nostril daily. Aim upward and outward. - Use Azelastine  2 sprays each nostril twice daily as needed for runny nose, drainage, sneezing, congestion. Aim upward and outward. - Use Zyrtec  10 mg daily.  - Consider allergy  shots (2 vials, 2 shots, RUSH) as long term control of your symptoms by teaching your immune system to be more tolerant of your allergy  triggers.     ALLERGEN AVOIDANCE MEASURES  Molds - Indoor avoidance Use air conditioning to reduce indoor humidity.  Do not use a humidifier. Keep indoor humidity at 30 - 40%.  Use a dehumidifier if needed. In the bathroom use an exhaust fan or open a window after showering.  Wipe down damp surfaces after showering.  Clean bathrooms with a mold-killing solution (diluted bleach, or products like Tilex, etc) at least once a month. In the kitchen use an exhaust fan to remove steam from cooking.  Throw away spoiled foods immediately, and empty garbage daily.  Empty water pans below self-defrosting refrigerators frequently. Vent the clothes dryer to the outside. Limit indoor houseplants; mold grows in the dirt.  No houseplants in the bedroom. Remove carpet from the bedroom. Encase the mattress and box springs with a zippered encasing.  Molds - Outdoor avoidance Avoid being outside when the grass is being mowed, or the ground is tilled. Avoid playing in leaves, pine straw, hay, etc.  Dead plant materials contain mold. Avoid going into barns or grain storage areas. Remove leaves, clippings and compost from around the home.  Pollen Avoidance Pollen levels are highest during the mid-day and afternoon.  Consider this when planning outdoor activities. Avoid being outside when the grass is being mowed, or wear a mask if the pollen-allergic person  must be the one to mow the grass. Keep the windows closed to keep pollen outside of the home. Use an air conditioner to filter the air. Take a shower, wash hair, and change clothing after working or playing outdoors during pollen season.

## 2024-09-05 NOTE — Progress Notes (Signed)
 FOLLOW UP Date of Service/Encounter:  09/05/24   Subjective:  William Benson (DOB: 08-01-1966) is a 58 y.o. male who [facility] for follow up for skin testing.   History obtained from: chart review and patient.  Anti histamines held.   Past Medical History: Past Medical History:  Diagnosis Date   DDD (degenerative disc disease), lumbosacral     Objective:  There were no vitals taken for this visit. There is no height or weight on file to calculate BMI. Physical Exam: GEN: alert, well developed HEENT: clear conjunctiva, MMM LUNGS: unlabored respiration   Skin Testing:  Skin prick testing was placed, which includes aeroallergens/foods, histamine control, and saline control.  Verbal consent was obtained prior to placing test.  Patient tolerated procedure well.  Allergy  testing results were read and interpreted by myself, documented by clinical staff. Adequate positive and negative control.  Positive results to:  Results discussed with patient/family.  Airborne Adult Perc - 09/05/24 1337     Time Antigen Placed 1337    Allergen Manufacturer Jestine    Location Back    Number of Test 55    1. Control-Buffer 50% Glycerol Negative    2. Control-Histamine 3+    3. Bahia Negative    4. French [place] Territ[place] Negative    5. Johnson 2+    6. [place]  Blue 3+    7. Meadow Fescue Negative    8. Perennial Rye 3+    9. Timothy Negative    10. Ragweed Mix 3+    11. Cocklebur Negative    12. Plantain,  English Negative    13. Baccharis Negative    14. Dog Fennel Negative    15. Russian Thistle Negative    16. Lamb's Quarters Negative    17. Sheep Sorrell Negative    18. Rough Pigweed Negative    19. Marsh Elder, Rough Negative    20. Mugwort, Common Negative    21. Box, Elder Negative    22. Cedar, red Negative    23. Sweet Gum 3+    24. Pecan Pollen 3+    25. Pine Mix Negative    26. Walnut, Black Pollen Negative    27. Red Mulberry  Negative    28. Ash Mix 3+    29. Birch Mix Negative    30. Beech American 2+    31. Cottonwood, [place] Negative    32. Hickory, White 2+    33. Maple Mix Negative    34. Oak, [place] Mix 3+    35. Sycamore Eastern Negative    36. Alternaria Alternata 3+    37. Cladosporium Herbarum Negative    38. Aspergillus Mix 2+    39. Penicillium Mix Negative    40. Bipolaris Sorokiniana (Helminthosporium) Negative    41. Drechslera Spicifera (Curvularia) Negative    42. Mucor Plumbeus Negative    43. Fusarium Moniliforme Negative    44. Aureobasidium Pullulans (pullulara) Negative    45. Rhizopus Oryzae Negative    46. Botrytis Cinera Negative    47. Epicoccum Nigrum 3+    48. Phoma Betae Negative    49. Dust Mite Mix Negative    50. Cat Hair 10,000 BAU/ml Negative    51.  Dog Epithelia Negative    52. Mixed Feathers Negative    53. Horse Epithelia Negative    54. Cockroach, German Negative    55. Tobacco Leaf Negative  Intradermal - 09/05/24 1355     Time Antigen Placed 1355    Allergen Manufacturer Jestine    Location Arm    Number of Test 10    Control Negative    Bahia Negative    French [place] Territ[place] Negative    Weed Mix Negative    Tree Mix Omitted    Mold 3 3+    Mold 4 Negative    Mite Mix Negative    Cat Negative    Dog Negative    Cockroach Negative           Assessment:   1. Seasonal allergic rhinitis due to pollen   2. Allergic rhinitis caused by mold     Plan/Recommendations:   Allergic Rhinitis: - Due to turbinate hypertrophy and unresponsive to over the counter meds, will perform skin testing to identify aeroallergen triggers.   - Positive skin test 08/2024: trees, grasses, weeds, molds,  - Avoidance measures discussed. - Use nasal saline rinses before nose sprays such as with Neilmed Sinus Rinse.  Use distilled water.   - Use Flonase  2 sprays each nostril daily. Aim upward and outward. - Use Azelastine  2 sprays each nostril twice daily as needed  for runny nose, drainage, sneezing, congestion. Aim upward and outward. - Use Zyrtec  10 mg daily.  - Consider allergy  shots (2 vials, 2 shots, RUSH) as long term control of your symptoms by teaching your immune system to be more tolerant of your allergy  triggers.      Return in about 3 months (around 12/05/2024).  Arleta Blanch, MD [facility] 

## 2024-09-05 NOTE — Telephone Encounter (Signed)
 William Benson called and stated that the script sent in for him today (azelastine  (ASTELIN ) 0.1 % nasal spray [[id number]] ) was sent into the wrong pharmacy. He states that he now uses [facility] in [place]. [place]. Address [address] [place] [address]. He states he communicated this to [name] last time he was here that this is to be his new pharmacy going forward. He asked if this script could be re sent for him.

## 2024-09-05 NOTE — Addendum Note (Signed)
 Addended by: AZALEA, Aditya Nastasi on: 09/05/2024 02:53 PM   Modules accepted: Orders

## 2024-09-06 MED ORDER — AZELASTINE HCL 0.1 % NA SOLN: 2.0000 | Freq: Two times a day (BID) | NASAL | 5 refills | Status: AC | PRN

## 2024-09-06 NOTE — Addendum Note (Signed)
 Addended by: MARCINE ISAIAH CROME on: 09/06/2024 09:12 AM   Modules accepted: Orders

## 2024-09-06 NOTE — Telephone Encounter (Signed)
 Spoke with the patient and informed him that Astelin  was resent in to [facility]. Verbalized understanding.

## 2024-12-24 ENCOUNTER — Ambulatory Visit: Admitting: Allergy and Immunology
